# Patient Record
Sex: Female | Born: 1965 | Race: White | Hispanic: No | Marital: Married | Smoking: Never smoker
Health system: Southern US, Community
[De-identification: ages and names within clinical notes are randomized; demographics above are authoritative.]

## PROBLEM LIST (undated history)

## (undated) DIAGNOSIS — N289 Disorder of kidney and ureter, unspecified: Secondary | ICD-10-CM

## (undated) DIAGNOSIS — T7840XA Allergy, unspecified, initial encounter: Secondary | ICD-10-CM

## (undated) DIAGNOSIS — N200 Calculus of kidney: Secondary | ICD-10-CM

## (undated) HISTORY — PX: TUBAL LIGATION: SHX77

## (undated) HISTORY — DX: Allergy, unspecified, initial encounter: T78.40XA

## (undated) HISTORY — DX: Calculus of kidney: N20.0

---

## 2005-12-03 ENCOUNTER — Emergency Department: Payer: Self-pay | Admitting: Emergency Medicine

## 2010-06-29 ENCOUNTER — Emergency Department: Payer: Self-pay | Admitting: Emergency Medicine

## 2011-03-22 ENCOUNTER — Ambulatory Visit: Payer: Self-pay | Admitting: Podiatry

## 2011-04-04 ENCOUNTER — Ambulatory Visit: Payer: Self-pay | Admitting: Internal Medicine

## 2012-08-14 ENCOUNTER — Emergency Department: Payer: Self-pay | Admitting: Internal Medicine

## 2012-08-14 LAB — CBC
HCT: 37.6 % (ref 35.0–47.0)
MCH: 28.4 pg (ref 26.0–34.0)
MCHC: 32 g/dL (ref 32.0–36.0)
MCV: 89 fL (ref 80–100)
Platelet: 208 10*3/uL (ref 150–440)
RBC: 4.24 10*6/uL (ref 3.80–5.20)
WBC: 5.5 10*3/uL (ref 3.6–11.0)

## 2012-08-14 LAB — COMPREHENSIVE METABOLIC PANEL
Anion Gap: 6 — ABNORMAL LOW (ref 7–16)
BUN: 21 mg/dL — ABNORMAL HIGH (ref 7–18)
Chloride: 107 mmol/L (ref 98–107)
Co2: 27 mmol/L (ref 21–32)
Creatinine: 0.75 mg/dL (ref 0.60–1.30)
Osmolality: 282 (ref 275–301)
Potassium: 3.9 mmol/L (ref 3.5–5.1)

## 2012-08-14 LAB — URINALYSIS, COMPLETE
Bilirubin,UR: NEGATIVE
Ketone: NEGATIVE
Leukocyte Esterase: NEGATIVE
Nitrite: NEGATIVE
Ph: 6 (ref 4.5–8.0)
Protein: NEGATIVE
Specific Gravity: 1.018 (ref 1.003–1.030)
Squamous Epithelial: 1

## 2012-08-15 ENCOUNTER — Emergency Department: Payer: Self-pay | Admitting: Emergency Medicine

## 2012-08-15 LAB — CBC
HCT: 38.7 % (ref 35.0–47.0)
MCHC: 33.4 g/dL (ref 32.0–36.0)
MCV: 88 fL (ref 80–100)
Platelet: 206 10*3/uL (ref 150–440)
RBC: 4.42 10*6/uL (ref 3.80–5.20)
RDW: 13.8 % (ref 11.5–14.5)

## 2012-08-15 LAB — BASIC METABOLIC PANEL
BUN: 18 mg/dL (ref 7–18)
Chloride: 109 mmol/L — ABNORMAL HIGH (ref 98–107)
Potassium: 3.7 mmol/L (ref 3.5–5.1)
Sodium: 139 mmol/L (ref 136–145)

## 2012-08-15 LAB — URINALYSIS, COMPLETE
Bacteria: NONE SEEN
Glucose,UR: NEGATIVE mg/dL (ref 0–75)
Leukocyte Esterase: NEGATIVE
Ph: 5 (ref 4.5–8.0)
RBC,UR: 8 /HPF (ref 0–5)
Specific Gravity: 1.016 (ref 1.003–1.030)
Squamous Epithelial: 2
WBC UR: 4 /HPF (ref 0–5)

## 2013-08-28 LAB — COMPREHENSIVE METABOLIC PANEL
ALT: 27 U/L (ref 12–78)
Albumin: 3.9 g/dL (ref 3.4–5.0)
Alkaline Phosphatase: 45 U/L
Anion Gap: 5 — ABNORMAL LOW (ref 7–16)
BILIRUBIN TOTAL: 0.4 mg/dL (ref 0.2–1.0)
BUN: 18 mg/dL (ref 7–18)
CHLORIDE: 107 mmol/L (ref 98–107)
Calcium, Total: 8.8 mg/dL (ref 8.5–10.1)
Co2: 27 mmol/L (ref 21–32)
Creatinine: 1 mg/dL (ref 0.60–1.30)
Glucose: 119 mg/dL — ABNORMAL HIGH (ref 65–99)
Osmolality: 281 (ref 275–301)
POTASSIUM: 3.8 mmol/L (ref 3.5–5.1)
SGOT(AST): 38 U/L — ABNORMAL HIGH (ref 15–37)
Sodium: 139 mmol/L (ref 136–145)
Total Protein: 7.3 g/dL (ref 6.4–8.2)

## 2013-08-28 LAB — TROPONIN I
Troponin-I: <0.02 ng/mL
Troponin-I: <0.02 ng/mL

## 2013-08-28 LAB — CBC
HCT: 39.4 % (ref 35.0–47.0)
HGB: 13 g/dL (ref 12.0–16.0)
MCH: 29.9 pg (ref 26.0–34.0)
MCHC: 33 g/dL (ref 32.0–36.0)
MCV: 90 fL (ref 80–100)
Platelet: 254 10*3/uL (ref 150–440)
RBC: 4.36 10*6/uL (ref 3.80–5.20)
RDW: 13.5 % (ref 11.5–14.5)
WBC: 7.9 10*3/uL (ref 3.6–11.0)

## 2013-08-28 LAB — LIPASE, BLOOD: Lipase: 174 U/L (ref 73–393)

## 2013-08-29 ENCOUNTER — Observation Stay: Payer: Self-pay | Admitting: Internal Medicine

## 2013-08-29 LAB — CBC WITH DIFFERENTIAL/PLATELET
BASOS ABS: 0.1 10*3/uL (ref 0.0–0.1)
Basophil %: 1.1 %
EOS PCT: 1.8 %
Eosinophil #: 0.1 10*3/uL (ref 0.0–0.7)
HCT: 35.3 % (ref 35.0–47.0)
HGB: 11.8 g/dL — ABNORMAL LOW (ref 12.0–16.0)
Lymphocyte #: 2.1 10*3/uL (ref 1.0–3.6)
Lymphocyte %: 35.8 %
MCH: 30.1 pg (ref 26.0–34.0)
MCHC: 33.4 g/dL (ref 32.0–36.0)
MCV: 90 fL (ref 80–100)
Monocyte #: 0.5 x10 3/mm (ref 0.2–0.9)
Monocyte %: 8.2 %
Neutrophil #: 3.2 10*3/uL (ref 1.4–6.5)
Neutrophil %: 53.1 %
Platelet: 202 10*3/uL (ref 150–440)
RBC: 3.91 10*6/uL (ref 3.80–5.20)
RDW: 13.6 % (ref 11.5–14.5)
WBC: 5.9 10*3/uL (ref 3.6–11.0)

## 2013-08-29 LAB — CK TOTAL AND CKMB (NOT AT ARMC)
CK, TOTAL: 33 U/L (ref 21–215)
CK, Total: 36 U/L (ref 21–215)
CK-MB: 0.7 ng/mL (ref 0.5–3.6)
CK-MB: 0.8 ng/mL (ref 0.5–3.6)

## 2013-08-29 LAB — BASIC METABOLIC PANEL
Anion Gap: 5 — ABNORMAL LOW (ref 7–16)
BUN: 18 mg/dL (ref 7–18)
CHLORIDE: 110 mmol/L — AB (ref 98–107)
Calcium, Total: 8.5 mg/dL (ref 8.5–10.1)
Co2: 24 mmol/L (ref 21–32)
Creatinine: 0.75 mg/dL (ref 0.60–1.30)
EGFR (Non-African Amer.): >60
Glucose: 86 mg/dL (ref 65–99)
Osmolality: 279 (ref 275–301)
Potassium: 3.9 mmol/L (ref 3.5–5.1)
SODIUM: 139 mmol/L (ref 136–145)

## 2013-08-29 LAB — URINALYSIS, COMPLETE
Bilirubin,UR: NEGATIVE
Glucose,UR: NEGATIVE mg/dL (ref 0–75)
Hyaline Cast: 1
Ketone: NEGATIVE
LEUKOCYTE ESTERASE: NEGATIVE
Nitrite: NEGATIVE
Ph: 7 (ref 4.5–8.0)
Protein: NEGATIVE
RBC,UR: 20 /HPF (ref 0–5)
Specific Gravity: 1.021 (ref 1.003–1.030)

## 2013-08-29 LAB — MAGNESIUM: Magnesium: 1.9 mg/dL

## 2013-08-29 LAB — TROPONIN I: Troponin-I: <0.02 ng/mL

## 2014-11-26 NOTE — Consult Note (Signed)
PATIENT NAME:  Becky Hatfield, Becky Hatfield MR#:  [id number] DATE OF BIRTH:  1965-08-07  DATE OF CONSULTATION:  08/29/2013  REFERRING PHYSICIAN:  Dr. Margaretmary Eddy CONSULTING PHYSICIAN:  Corey Skains, MD  REASON FOR CONSULTATION: Chest pain consistent with unstable angina.   CHIEF COMPLAINT: "I have chest pain."   HISTORY OF PRESENT ILLNESS: This is a 49 year old female with no evidence of significant cardiovascular risk factors and no hypertension, hyperlipidemia or previous cardiovascular disease that has had new onset of substernal chest discomfort, pressure in nature occurring at rest consistent with [place] class IV angina radiating into her right side associated with shortness of breath and weakness. This lasted off and on and was not necessarily relieved by any intervention including nonsteroidals and/or nitroglycerin. She does feel somewhat improved at this time with an EKG which was normal and troponins which are normal. There are no other cardiovascular history or significant symptoms.  REVIEW OF SYSTEMS:  Negative for vision change, ringing in the ears, hearing loss, cough, congestion, heartburn, nausea, vomiting, diarrhea, bloody stools, stomach pain, extremity pain, leg weakness, cramping of the buttocks, known blood clots, headaches, blackouts, dizzy spells, nosebleeds, congestion, trouble swallowing, frequent urination, urination at night, muscle weakness, numbness, anxiety, skin rashes.   PAST MEDICAL HISTORY: None.   FAMILY HISTORY: No family members with early onset of cardiovascular disease or hypertension.   SOCIAL HISTORY: Currently denies alcohol or tobacco use.   ALLERGIES: No known drug allergies.   CURRENT MEDICATIONS: As listed.   PHYSICAL EXAMINATION: VITAL SIGNS: Blood pressure is 110/68 bilaterally, heart rate 72 upright, reclining, and regular.    GENERAL: She is a well appearing female in no acute distress.  HEENT: No icterus, thyromegaly, ulcers, hemorrhage or xanthelasma.   CARDIOVASCULAR: Regular rate and rhythm. Normal S1 and S2 without murmur, gallop, or rub. PMI is normal size and placement. Carotid upstroke normal without bruit. Jugular venous pressure is normal.  LUNGS: Clear to auscultation with normal respirations.  ABDOMEN: Soft, nontender without hepatosplenomegaly or masses. Abdominal aorta is normal size without bruit.  EXTREMITIES: Showing 2+ bilateral pulses in dorsal, pedal, radial and femoral arteries without lower extremity edema, cyanosis, clubbing or ulcers.  NEUROLOGIC: She is oriented to time, place, and person, with normal mood and affect.   ASSESSMENT: A 49 year old female with no cardiovascular history having chest discomfort, atypical in nature, with a normal troponin, normal EKG, and no evidence of myocardial infarction.   RECOMMENDATIONS: 1. Continue serial ECG and enzymes to assess for myocardial infarction.  2. Echocardiogram for LV systolic dysfunction, valvular heart disease causing above.  3. Ambulation and follow for any further significant symptoms. 4. Stress test versus discharge to home with outpatient work-up thereafter.  5. Further treatment and options for possible viral chest discomfort.  ____________________________ Corey Skains, MD bjk:sg D: 08/29/2013 07:14:00 ET T: 08/29/2013 10:55:15 ET JOB#: [id number]  cc: Corey Skains, MD, <Dictator> Corey Skains MD ELECTRONICALLY SIGNED 09/10/2013 9:49

## 2014-11-26 NOTE — H&P (Signed)
PATIENT NAME:  Becky Hatfield, Becky Hatfield MR#:  [id number] DATE OF BIRTH:  06-03-1966  DATE OF ADMISSION:  08/29/2013  PRIMARY CARE PHYSICIAN:  Dr. Jarome Lamas.  REFERRING PHYSICIAN:  Dr. Joni Fears.   CHIEF COMPLAINT:  Chest pain.   HISTORY OF PRESENT ILLNESS:  The patient is a 49 year old Caucasian female with a past medical history of nephrolithiasis and Tietze syndrome is presenting to the ER with a chief complaint of midsternal reproducible chest pain, started at 7:00 p.m. last night.  Initially it was associated with a little bit of shortness of breath and nausea, but denies any vomiting.  Denies any diaphoresis.  The pain is aching and squeezing in nature.  It is with no radiation and intermittent.  The patient has chronic history of Tietze syndrome and takes ibuprofen as needed basis.  Her physician has told her to take ibuprofen as needed basis.  The patient is reporting that she is stressed out and she thinks probably her Tietze syndrome is acting up.  The patient's chest pain is not completely resolved which made her come to the ER today.  The patient was given sublingual nitroglycerin, following that her chest pain is resolved.  Subsequently nitro paste is attached to the anterior chest wall.  The pain was resolved with nitro paste, but during my examination, she is still complaining that the pain is coming back.  She was not seen by any cardiologist in the past.  Never had any stress test done.  Denies any abdominal pain.  No other complaints.   PAST MEDICAL HISTORY:  Nephrolithiasis, Tietze syndrome.   PAST SURGICAL HISTORY:  Tubal ligation.   ALLERGIES:  No known drug allergies.   PSYCHOSOCIAL HISTORY:  Lives at home, lives with husband.  No smoking, alcohol or illicit drug usage.   MEDICATIONS:  Not on any home medications.   FAMILY HISTORY:  Mother and father has hypertension and father deceased with cerebral aneurysm.   REVIEW OF SYSTEMS:  CONSTITUTIONAL:  Denies fever, fatigue. EYES:   Denies blurry vision, double vision.  EARS, NOSE, THROAT:  Denies epistaxis or discharge.  RESPIRATION:  Denies cough, COPD.  CARDIOVASCULAR:  Complaining of reproducible chest pain in the midsternal area with no radiation.  Denies palpitations.  GASTROINTESTINAL:  Nauseous, but no vomiting, diarrhea.  GENITOURINARY:  No dysuria or hematuria.  GYNECOLOGIC AND BREAST:  Denies breast mass or vaginal discharge.  ENDOCRINE:  Denies polyuria, nocturia.  HEMATOLOGIC AND LYMPHATIC:  No anemia, easy bruising, bleeding.  INTEGUMENTARY:  No acne, rash, lesions.  MUSCULOSKELETAL:  No joint pain in the neck and back.  Denies gout.   NEUROLOGIC:  Denies any vertigo or ataxia.  PSYCHIATRIC:  No ADD, OCD.   PHYSICAL EXAMINATION:   VITAL SIGNS:  Temperature is not recorded, pulse 88, respirations 16 to 18, blood pressure 112/67, sating 100% on room air.  GENERAL APPEARANCE:  Not under acute distress.  Moderately built and thin-looking emaciated lady.  HEENT:  Normocephalic, atraumatic.  Pupils are equal, reacting to light and accommodation.  No scleral icterus.  No conjunctival injection.  No sinus tenderness.  No postnasal drip.  Moist mucous membranes.  NECK:  Supple.  No JVD.  No thyromegaly.  LUNGS:  Clear to auscultation bilaterally.  No accessory muscle usage.  Positive anterior chest wall tenderness in the midsternal area on palpation.  CARDIAC:  S1, S2 normal.  Regular rate and rhythm.  No murmurs.  GASTROINTESTINAL:  Soft.  Bowel sounds are positive in all four quadrants.  Nontender, nondistended.  No masses felt.  No hepatosplenomegaly.  NEUROLOGIC:  Awake, alert, oriented x 3.  Motor and sensory grossly intact.  Reflexes are 2+.  EXTREMITIES:  No edema.  No cyanosis.  No clubbing.  SKIN:  Warm to touch.  Normal turgor.  No rashes.  No lesions.  MUSCULOSKELETAL:  No joint effusion, tenderness, erythema.  PSYCHIATRIC:  Normal mood and affect.   LABORATORY AND IMAGING STUDIES:  LFTs normal except  AST which is slightly elevated at 38.  Troponin less than 0.02 x 2.  CBC normal.  D-dimer 0.26.  Chem-8 is normal, except anion gap which is at 5 and glucose is at 119.  Ultrasound of the abdomen, no acute findings.  Liver granuloma, right renal calculus.  Chest x-ray, portable:  No active disease.  A 12-lead EKG, normal sinus rhythm, normal PR and QRS interval.  No acute ST-T wave changes.   ASSESSMENT AND PLAN:  A 49 year old pleasant Caucasian female with chronic history of Tietze syndrome is presenting to the ER with a chief complaint of midsternal chest pain which is intermittent in nature.  It started at 7:00 p.m. yesterday, will be admitted with the following assessment and plan.  1.  Chest pain, rule out acute myocardial infarction, probably from Tietze syndrome.  Admit her to telemetry, acute coronary syndrome protocol with nitroglycerin, aspirin, low-dose beta blocker and statin.  Cycle cardiac biomarkers.  Cardiology consult is placed.  2.  History of Tietze syndrome.  We will provide her ibuprofen as needed basis.  3.  We will provide gastrointestinal and deep vein thrombosis prophylaxis.   Diagnosis and plan of care was discussed in detail with the patient and her husband at bedside.  They both verbalized understanding of the plan.    Total time spent on the admission is 45 minutes.     ____________________________ Nicholes Mango, MD ag:ea D: 08/29/2013 00:46:42 ET T: 08/29/2013 01:58:01 ET JOB#: [id number]  cc: Nicholes Mango, MD, <Dictator> Irven Easterly. Kary Kos, MD Nicholes Mango MD ELECTRONICALLY SIGNED 09/10/2013 7:36

## 2014-11-26 NOTE — Discharge Summary (Signed)
PATIENT NAME:  Becky Hatfield, Becky Hatfield MR#:  [id number] DATE OF BIRTH:  1966-02-20  DATE OF ADMISSION:  08/29/2013  DATE OF DISCHARGE:  08/29/2013  ADMISSION DIAGNOSIS: Chest pain.   DISCHARGE DIAGNOSIS: Chest pain.   PERTINENT LABORATORIES: Troponins x 3 were negative. White blood cells 5.9, hemoglobin 12, hematocrit 35.3, platelets are 282. Sodium 139, potassium 3.9, chloride 110, bicarb 24, BUN 18, creatinine 0.75, glucose 86, magnesium 1.9.   CONSULTATIONS: Cardiology.   HOSPITAL COURSE:  A 49 year old female who presented with chest pain. For further details, please refer to the H and P.   1. Chest pain. The patient was admitted to telemetry. Her cardiac enzymes were normal. Cardiology was consulted. I spoke with Dr. Nehemiah Massed, who felt that the patient was low risk for cardiac disease. I did recommend an outpatient stress test. The patient will follow up with Dr. Nehemiah Massed early next week for an outpatient stress test. Plan of care was discussed with the patient.  2.  Tietze syndrome, which is inflammation of the sternum. She uses NSAIDs p.r.n.   3.  Kidney stone. Asymptomatic at this time.   DISCHARGE MEDICATIONS: Aspirin 81 mg daily.   DISCHARGE DIET:  Regular diet.   DISCHARGE ACTIVITY: As tolerated.   DISCHARGE FOLLOW UP:  The patient will follow up with Dr. Nehemiah Massed early next week.   TIME SPENT: Approximately 35 minutes. The patient is medically stable for discharge.    ____________________________ Duan Scharnhorst P. Benjie Karvonen, MD spm:mr D: 08/29/2013 12:31:21 ET T: 08/29/2013 19:52:52 ET JOB#: [id number]  cc: Yohannes Waibel P. Benjie Karvonen, MD, <Dictator> Corey Skains, MD Irven Easterly. Kary Kos, MD   Donell Beers Jazmynn Pho MD ELECTRONICALLY SIGNED 08/30/2013 14:06

## 2015-07-03 ENCOUNTER — Telehealth: Payer: Self-pay | Admitting: *Deleted

## 2015-07-03 NOTE — Telephone Encounter (Signed)
-----   Message from Francia Greaves sent at 07/03/2015  2:57 PM EST ----- Regarding: Returning Phone Call Contact: [phone] Called returning a phone call, birth control related maybe?

## 2015-07-27 ENCOUNTER — Encounter: Payer: Self-pay | Admitting: Obstetrics and Gynecology

## 2015-07-27 ENCOUNTER — Ambulatory Visit (INDEPENDENT_AMBULATORY_CARE_PROVIDER_SITE_OTHER): Payer: BC Managed Care – PPO | Admitting: Obstetrics and Gynecology

## 2015-07-27 VITALS — BP 118/77 | HR 79 | Wt 149.0 lb

## 2015-07-27 DIAGNOSIS — Z124 Encounter for screening for malignant neoplasm of cervix: Secondary | ICD-10-CM | POA: Diagnosis not present

## 2015-07-27 DIAGNOSIS — Z01419 Encounter for gynecological examination (general) (routine) without abnormal findings: Secondary | ICD-10-CM | POA: Diagnosis not present

## 2015-07-27 DIAGNOSIS — Z1151 Encounter for screening for human papillomavirus (HPV): Secondary | ICD-10-CM

## 2015-07-27 NOTE — Progress Notes (Signed)
  Subjective:     Becky Hatfield is a 49 y.o. female G89P2002 perimenopausal who is here for a comprehensive physical exam. The patient reports no problems. She is transferring her care from [facility] secondary to proximity to her home. She was last seen there 3 years ago. She has not had a mammogram in the last 3 years. She is sexually active using BTL for contraception. She reports irregular menses lasting 4-5 days. She often skips months. She reports some hot flushes and night sweats which are manageable. She denies urinary incontinence.  History reviewed. No pertinent past medical history. Past Surgical History  Procedure Laterality Date  . Tubal ligation     Family History  Problem Relation Age of Onset  . Hypertension Mother   . Hypertension Father     Social History   Social History  . Marital Status: Married    Spouse Name: N/A  . Number of Children: N/A  . Years of Education: N/A   Occupational History  . Not on file.   Social History Main Topics  . Smoking status: Never Smoker   . Smokeless tobacco: Never Used  . Alcohol Use: No  . Drug Use: No  . Sexual Activity:    Partners: Male    Birth Control/ Protection: Surgical   Other Topics Concern  . Not on file   Social History Narrative  . No narrative on file   Health Maintenance  Topic Date Due  . HIV Screening  03/16/1980  . TETANUS/TDAP  03/16/1984  . PAP SMEAR  03/16/1986  . INFLUENZA VACCINE  03/06/2015  . MAMMOGRAM  03/17/2015  . COLONOSCOPY  03/17/2015       Review of Systems Pertinent items are noted in HPI.   Objective:      GENERAL: Well-developed, well-nourished female in no acute distress.  HEENT: Normocephalic, atraumatic. Sclerae anicteric.  NECK: Supple. Normal thyroid.  LUNGS: Clear to auscultation bilaterally.  HEART: Regular rate and rhythm. BREASTS: Symmetric in size. No palpable masses or lymphadenopathy, skin changes, or nipple drainage. Patient has  extra mammary nipples  bilaterally ABDOMEN: Soft, nontender, nondistended. No organomegaly. PELVIC: Normal external female genitalia. Vagina is pink and rugated.  Normal discharge. Normal appearing cervix. Uterus is normal in size. No adnexal mass or tenderness. EXTREMITIES: No cyanosis, clubbing, or edema, 2+ distal pulses.    Assessment:    Healthy female exam.      Plan:    Pap smear performed Screening mammogram ordered Patient advised to perform self breast and vulva exams monthly Patient will be contacted with any abnormal results RTC in 1 year or prn See After Visit Summary for Counseling Recommendations

## 2015-08-01 LAB — CYTOLOGY - PAP

## 2015-08-02 ENCOUNTER — Encounter: Payer: Self-pay | Admitting: *Deleted

## 2015-08-24 ENCOUNTER — Ambulatory Visit
Admission: RE | Admit: 2015-08-24 | Discharge: 2015-08-24 | Disposition: A | Discharge status: Home / Self Care | Payer: BC Managed Care – PPO | Source: Ambulatory Visit | Attending: Obstetrics and Gynecology | Admitting: Obstetrics and Gynecology

## 2015-08-24 DIAGNOSIS — Z1231 Encounter for screening mammogram for malignant neoplasm of breast: Secondary | ICD-10-CM | POA: Diagnosis present

## 2015-08-24 DIAGNOSIS — Z01419 Encounter for gynecological examination (general) (routine) without abnormal findings: Secondary | ICD-10-CM

## 2015-09-05 ENCOUNTER — Emergency Department
Admission: EM | Admit: 2015-09-05 | Discharge: 2015-09-05 | Disposition: A | Discharge status: Home / Self Care | Payer: BC Managed Care – PPO | Attending: Emergency Medicine | Admitting: Emergency Medicine

## 2015-09-05 ENCOUNTER — Encounter: Payer: Self-pay | Admitting: Emergency Medicine

## 2015-09-05 ENCOUNTER — Emergency Department: Payer: BC Managed Care – PPO

## 2015-09-05 DIAGNOSIS — Z3202 Encounter for pregnancy test, result negative: Secondary | ICD-10-CM | POA: Insufficient documentation

## 2015-09-05 DIAGNOSIS — N200 Calculus of kidney: Secondary | ICD-10-CM | POA: Diagnosis not present

## 2015-09-05 DIAGNOSIS — R109 Unspecified abdominal pain: Secondary | ICD-10-CM | POA: Diagnosis present

## 2015-09-05 HISTORY — DX: Disorder of kidney and ureter, unspecified: N28.9

## 2015-09-05 LAB — BASIC METABOLIC PANEL
ANION GAP: 7 (ref 5–15)
BUN: 14 mg/dL (ref 6–20)
CALCIUM: 9.4 mg/dL (ref 8.9–10.3)
CO2: 26 mmol/L (ref 22–32)
CREATININE: 0.68 mg/dL (ref 0.44–1.00)
Chloride: 106 mmol/L (ref 101–111)
GFR calc non Af Amer: >60 mL/min (ref >60)
GLUCOSE: 100 mg/dL — AB (ref 65–99)
Potassium: 3.7 mmol/L (ref 3.5–5.1)
Sodium: 139 mmol/L (ref 135–145)

## 2015-09-05 LAB — URINALYSIS COMPLETE WITH MICROSCOPIC (ARMC ONLY)
Bilirubin Urine: NEGATIVE
Glucose, UA: NEGATIVE mg/dL
Ketones, ur: NEGATIVE mg/dL
LEUKOCYTES UA: NEGATIVE
Nitrite: NEGATIVE
PROTEIN: NEGATIVE mg/dL
SPECIFIC GRAVITY, URINE: 1.009 (ref 1.005–1.030)
pH: 6 (ref 5.0–8.0)

## 2015-09-05 LAB — CBC
HCT: 41.9 % (ref 35.0–47.0)
Hemoglobin: 14 g/dL (ref 12.0–16.0)
MCH: 29.7 pg (ref 26.0–34.0)
MCHC: 33.5 g/dL (ref 32.0–36.0)
MCV: 88.4 fL (ref 80.0–100.0)
PLATELETS: 247 10*3/uL (ref 150–440)
RBC: 4.73 MIL/uL (ref 3.80–5.20)
RDW: 13.5 % (ref 11.5–14.5)
WBC: 6.2 10*3/uL (ref 3.6–11.0)

## 2015-09-05 LAB — POCT PREGNANCY, URINE: Preg Test, Ur: NEGATIVE

## 2015-09-05 MED ORDER — TAMSULOSIN HCL 0.4 MG PO CAPS: 0.4000 mg | ORAL_CAPSULE | Freq: Every day | ORAL | Status: DC

## 2015-09-05 MED ORDER — OXYCODONE-ACETAMINOPHEN 5-325 MG PO TABS
ORAL_TABLET | ORAL | Status: AC
  Administered 2015-09-05: 2 via ORAL
  Filled 2015-09-05: qty 2

## 2015-09-05 MED ORDER — OXYCODONE-ACETAMINOPHEN 5-325 MG PO TABS
2.0000 | ORAL_TABLET | Freq: Once | ORAL | Status: AC
  Administered 2015-09-05: 2 via ORAL

## 2015-09-05 MED ORDER — ONDANSETRON HCL 4 MG PO TABS: 4.0000 mg | ORAL_TABLET | Freq: Three times a day (TID) | ORAL | Status: DC | PRN

## 2015-09-05 MED ORDER — OXYCODONE-ACETAMINOPHEN 5-325 MG PO TABS: 1.0000 | ORAL_TABLET | ORAL | Status: DC | PRN

## 2015-09-05 NOTE — ED Provider Notes (Signed)
[facility] Emergency Department Provider Note   ____________________________________________  Time seen: ~1500  I have reviewed the triage vital signs and the nursing notes.   HISTORY  Chief Complaint Flank Pain   History limited by: Not Limited   HPI Becky Hatfield is a 50 y.o. female with history of kidney stones who presents to the emergency department todaybecause of right flank pain. She states that the pain has been somewhat on and off for the past week. She states today however the pain became worse. She describes it as sharp. It is located in the right flank and radiates towards the groin. It has been intermittent. It was severe. She states it does remind her of her previous kidney stones. She has not noticed any fevers. She did have some nausea earlier today.     Past Medical History  Diagnosis Date  . Renal disorder     There are no active problems to display for this patient.   Past Surgical History  Procedure Laterality Date  . Tubal ligation      No current outpatient prescriptions on file.  Allergies Review of patient's allergies indicates no known allergies.  Family History  Problem Relation Age of Onset  . Hypertension Mother   . Hypertension Father   . Breast cancer Neg Hx     Social History Social History  Substance Use Topics  . Smoking status: Never Smoker   . Smokeless tobacco: Never Used  . Alcohol Use: No    Review of Systems  Constitutional: Negative for fever. Cardiovascular: Negative for chest pain. Respiratory: Negative for shortness of breath. Gastrointestinal: Positive for right flank pain Neurological: Negative for headaches, focal weakness or numbness.   10-point ROS otherwise negative.  ____________________________________________   PHYSICAL EXAM:  VITAL SIGNS: ED Triage Vitals  Enc Vitals Group     BP 09/05/15 1225 160/96 mmHg     Pulse Rate 09/05/15 1225 92     Resp 09/05/15 1225  18     Temp 09/05/15 1225 98.1 F (36.7 C)     Temp Source 09/05/15 1225 Oral     SpO2 09/05/15 1225 99 %     Weight 09/05/15 1225 149 lb (67.586 kg)     Height 09/05/15 1225 5\' 4"  (1.626 m)     Head Cir --      Peak Flow --      Pain Score 09/05/15 1222 9   Constitutional: Alert and oriented. Well appearing and in no distress. Eyes: Conjunctivae are normal. PERRL. Normal extraocular movements. ENT   Head: Normocephalic and atraumatic.   Nose: No congestion/rhinnorhea.   Mouth/Throat: Mucous membranes are moist.   Neck: No stridor. Hematological/Lymphatic/Immunilogical: No cervical lymphadenopathy. Cardiovascular: Normal rate, regular rhythm.  No murmurs, rubs, or gallops. Respiratory: Normal respiratory effort without tachypnea nor retractions. Breath sounds are clear and equal bilaterally. No wheezes/rales/rhonchi. Gastrointestinal: Soft and minimally tender to palpation in the right lower abdomen. No rebound. No guarding. There is no CVA tenderness. Genitourinary: Deferred Musculoskeletal: Normal range of motion in all extremities. No joint effusions.  No lower extremity tenderness nor edema. Neurologic:  Normal speech and language. No gross focal neurologic deficits are appreciated.  Skin:  Skin is warm, dry and intact. No rash noted. Psychiatric: Mood and affect are normal. Speech and behavior are normal. Patient exhibits appropriate insight and judgment.  ____________________________________________    LABS (pertinent positives/negatives)  Labs Reviewed  URINALYSIS COMPLETEWITH MICROSCOPIC ([place]) - Abnormal; Notable for the following:  Color, Urine YELLOW (*)    APPearance CLEAR (*)    Hgb urine dipstick 3+ (*)    Bacteria, UA RARE (*)    Squamous Epithelial / LPF 0-5 (*)    All other components within normal limits  BASIC METABOLIC PANEL - Abnormal; Notable for the following:    Glucose, Bld 100 (*)    All other components within normal limits   CBC  POCT PREGNANCY, URINE  POC URINE PREG, ED     ____________________________________________   EKG  None  ____________________________________________    RADIOLOGY  US renal IMPRESSION: Moderate right-sided hydronephrosis possibly due to a partially obstructing right ureteral calculus.  [place] right renal calculus measuring 6 mm.  Bilateral jets noted.   ____________________________________________   PROCEDURES  Procedure(s) performed: None  Critical Care performed: No  ____________________________________________   INITIAL IMPRESSION / ASSESSMENT AND PLAN / ED COURSE  Pertinent labs & imaging results that were available during my care of the patient were reviewed by me and considered in my medical decision making (see chart for details).  Patient presented to the emergency department today because of concerns for right flank pain. Patient does have a history of kidney stones. There was blood in the urine so an ultrasound was performed which showed mild to moderate hydronephrosis however bilateral jets were present. At this point patient's pain is well-controlled. Will plan on discharging home with Flomax, pain medication and antiemetics. Discussed with patient finding of ultrasound and that she should follow-up with urology clinic. No signs of any infection.  ____________________________________________   FINAL CLINICAL IMPRESSION(S) / ED DIAGNOSES  Final diagnoses:  Kidney stone     Nance Pear, MD 09/05/15 1726

## 2015-09-05 NOTE — Discharge Instructions (Signed)
Please seek medical attention for any high fevers, chest pain, shortness of breath, change in behavior, persistent vomiting, bloody stool or any other new or concerning symptoms. ° ° °Kidney Stones °Kidney stones (urolithiasis) are deposits that form inside your kidneys. The intense pain is caused by the stone moving through the urinary tract. When the stone moves, the ureter goes into spasm around the stone. The stone is usually passed in the urine.  °CAUSES  °· A disorder that makes certain neck glands produce too much parathyroid hormone (primary hyperparathyroidism). °· A buildup of uric acid crystals, similar to gout in your joints. °· Narrowing (stricture) of the ureter. °· A kidney obstruction present at birth (congenital obstruction). °· Previous surgery on the kidney or ureters. °· Numerous kidney infections. °SYMPTOMS  °· Feeling sick to your stomach (nauseous). °· Throwing up (vomiting). °· Blood in the urine (hematuria). °· Pain that usually spreads (radiates) to the groin. °· Frequency or urgency of urination. °DIAGNOSIS  °· Taking a history and physical exam. °· Blood or urine tests. °· CT scan. °· Occasionally, an examination of the inside of the urinary bladder (cystoscopy) is performed. °TREATMENT  °· Observation. °· Increasing your fluid intake. °· Extracorporeal shock wave lithotripsy--This is a noninvasive procedure that uses shock waves to break up kidney stones. °· Surgery may be needed if you have severe pain or persistent obstruction. There are various surgical procedures. Most of the procedures are performed with the use of small instruments. Only small incisions are needed to accommodate these instruments, so recovery time is minimized. °The size, location, and chemical composition are all important variables that will determine the proper choice of action for you. Talk to your health care provider to better understand your situation so that you will minimize the risk of injury to yourself  and your kidney.  °HOME CARE INSTRUCTIONS  °· Drink enough water and fluids to keep your urine clear or pale yellow. This will help you to pass the stone or stone fragments. °· Strain all urine through the provided strainer. Keep all particulate matter and stones for your health care provider to see. The stone causing the pain may be as small as a grain of salt. It is very important to use the strainer each and every time you pass your urine. The collection of your stone will allow your health care provider to analyze it and verify that a stone has actually passed. The stone analysis will often identify what you can do to reduce the incidence of recurrences. °· Only take over-the-counter or prescription medicines for pain, discomfort, or fever as directed by your health care provider. °· Keep all follow-up visits as told by your health care provider. This is important. °· Get follow-up X-rays if required. The absence of pain does not always mean that the stone has passed. It may have only stopped moving. If the urine remains completely obstructed, it can cause loss of kidney function or even complete destruction of the kidney. It is your responsibility to make sure X-rays and follow-ups are completed. Ultrasounds of the kidney can show blockages and the status of the kidney. Ultrasounds are not associated with any radiation and can be performed easily in a matter of minutes. °· Make changes to your daily diet as told by your health care provider. You may be told to: °¨ Limit the amount of salt that you eat. °¨ Eat 5 or more servings of fruits and vegetables each day. °¨ Limit the amount of meat,   poultry, fish, and eggs that you eat. °· Collect a 24-hour urine sample as told by your health care provider. You may need to collect another urine sample every 6-12 months. °SEEK MEDICAL CARE IF: °· You experience pain that is progressive and unresponsive to any pain medicine you have been prescribed. °SEEK IMMEDIATE  MEDICAL CARE IF:  °· Pain cannot be controlled with the prescribed medicine. °· You have a fever or shaking chills. °· The severity or intensity of pain increases over 18 hours and is not relieved by pain medicine. °· You develop a new onset of abdominal pain. °· You feel faint or pass out. °· You are unable to urinate. °  °This information is not intended to replace advice given to you by your health care provider. Make sure you discuss any questions you have with your health care provider. °  °Document Released: 07/22/2005 Document Revised: 04/12/2015 Document Reviewed: 12/23/2012 °Elsevier Interactive Patient Education ©2016 [employer] ° °

## 2015-09-05 NOTE — ED Notes (Signed)
Pt to ed with c/o right flank pain, hx of kidney stones,  Pt denies difficulty with urination but reports dark colored urine.  Pt alert and oriented,  Appears in mild distress.

## 2015-09-05 NOTE — ED Notes (Signed)
Pt informed to return if any life threatening symptoms occur.  

## 2016-07-31 ENCOUNTER — Ambulatory Visit (INDEPENDENT_AMBULATORY_CARE_PROVIDER_SITE_OTHER): Payer: BC Managed Care – PPO | Admitting: Obstetrics and Gynecology

## 2016-07-31 ENCOUNTER — Encounter: Payer: Self-pay | Admitting: Obstetrics and Gynecology

## 2016-07-31 VITALS — BP 132/83 | HR 94 | Ht 64.0 in | Wt 148.0 lb

## 2016-07-31 DIAGNOSIS — Z124 Encounter for screening for malignant neoplasm of cervix: Secondary | ICD-10-CM

## 2016-07-31 DIAGNOSIS — Z1151 Encounter for screening for human papillomavirus (HPV): Secondary | ICD-10-CM

## 2016-07-31 DIAGNOSIS — Z01419 Encounter for gynecological examination (general) (routine) without abnormal findings: Secondary | ICD-10-CM

## 2016-07-31 LAB — LIPID PANEL
CHOL/HDL RATIO: 3.2 ratio (ref <5.0)
CHOLESTEROL: 214 mg/dL — AB (ref <200)
HDL: 67 mg/dL (ref >50)
LDL Cholesterol: 132 mg/dL — ABNORMAL HIGH (ref <100)
Triglycerides: 76 mg/dL (ref <150)
VLDL: 15 mg/dL (ref <30)

## 2016-07-31 LAB — TSH: TSH: 1.57 m[IU]/L

## 2016-07-31 LAB — CBC
HEMATOCRIT: 43.5 % (ref 35.0–45.0)
HEMOGLOBIN: 14 g/dL (ref 11.7–15.5)
MCH: 29.5 pg (ref 27.0–33.0)
MCHC: 32.2 g/dL (ref 32.0–36.0)
MCV: 91.6 fL (ref 80.0–100.0)
MPV: 9.9 fL (ref 7.5–12.5)
Platelets: 247 10*3/uL (ref 140–400)
RBC: 4.75 MIL/uL (ref 3.80–5.10)
RDW: 13 % (ref 11.0–15.0)
WBC: 4.8 10*3/uL (ref 3.8–10.8)

## 2016-07-31 LAB — COMPREHENSIVE METABOLIC PANEL
ALBUMIN: 4.3 g/dL (ref 3.6–5.1)
ALT: 19 U/L (ref 6–29)
AST: 17 U/L (ref 10–35)
Alkaline Phosphatase: 47 U/L (ref 33–130)
BILIRUBIN TOTAL: 0.6 mg/dL (ref 0.2–1.2)
BUN: 16 mg/dL (ref 7–25)
CALCIUM: 9.2 mg/dL (ref 8.6–10.4)
CO2: 25 mmol/L (ref 20–31)
CREATININE: 0.73 mg/dL (ref 0.50–1.05)
Chloride: 106 mmol/L (ref 98–110)
Glucose, Bld: 102 mg/dL — ABNORMAL HIGH (ref 65–99)
Potassium: 4.1 mmol/L (ref 3.5–5.3)
SODIUM: 139 mmol/L (ref 135–146)
TOTAL PROTEIN: 6.8 g/dL (ref 6.1–8.1)

## 2016-07-31 NOTE — Progress Notes (Signed)
Subjective:     Becky Hatfield is a 50 y.o. female G2P2 with BMI 25 who is here for a comprehensive physical exam. The patient reports no problems. She is sexually active without complaints. She uses BTL for contraception. She reports amenorrhea for the past 9-10 months. She has been experiencing some hot flushes and night sweats which are still manageable. She denies abnormal discharge or pelvic pain. She denies urinary incontinence  Past Medical History:  Diagnosis Date  . Renal disorder    Past Surgical History:  Procedure Laterality Date  . TUBAL LIGATION     Family History  Problem Relation Age of Onset  . Hypertension Mother   . Hypertension Father   . Breast cancer Neg Hx    Social History   Social History  . Marital status: Married    Spouse name: N/A  . Number of children: N/A  . Years of education: N/A   Occupational History  . Not on file.   Social History Main Topics  . Smoking status: Never Smoker  . Smokeless tobacco: Never Used  . Alcohol use No  . Drug use: No  . Sexual activity: Yes    Partners: Male    Birth control/ protection: Surgical   Other Topics Concern  . Not on file   Social History Narrative  . No narrative on file   Health Maintenance  Topic Date Due  . HIV Screening  03/16/1980  . TETANUS/TDAP  03/16/1984  . COLONOSCOPY  03/17/2015  . INFLUENZA VACCINE  03/05/2016  . MAMMOGRAM  08/23/2017  . PAP SMEAR  07/26/2018       Review of Systems Pertinent items are noted in HPI.   Objective:      Blood pressure 132/83, pulse 94, height 5\' 4"  (1.626 m), weight 148 lb (67.1 kg). GENERAL: Well-developed, well-nourished female in no acute distress.  HEENT: Normocephalic, atraumatic. Sclerae anicteric.  NECK: Supple. Normal thyroid.  LUNGS: Clear to auscultation bilaterally.  HEART: Regular rate and rhythm. BREASTS: Symmetric in size. No palpable masses or lymphadenopathy, skin changes, or nipple drainage. ABDOMEN: Soft, nontender,  nondistended. No organomegaly. PELVIC: Normal external female genitalia. Vagina is pink and rugated.  Normal discharge. Normal appearing cervix. Uterus is normal in size. No adnexal mass or tenderness. EXTREMITIES: No cyanosis, clubbing, or edema, 2+ distal pulses.    Assessment:    Healthy female exam.      Plan:    pap smear collected Patient had a normal mammogram in 08/2015 and is scheduled to have her next one Patient advised to continue monthly self breast and vulva exam Patient will be contacted with any abnormal results See After Visit Summary for Counseling Recommendations

## 2016-08-01 ENCOUNTER — Telehealth: Payer: Self-pay | Admitting: *Deleted

## 2016-08-01 ENCOUNTER — Other Ambulatory Visit: Payer: Self-pay | Admitting: Obstetrics and Gynecology

## 2016-08-01 DIAGNOSIS — Z1231 Encounter for screening mammogram for malignant neoplasm of breast: Secondary | ICD-10-CM

## 2016-08-01 LAB — LUTEINIZING HORMONE: LH: 37.5 m[IU]/mL

## 2016-08-01 LAB — CYTOLOGY - PAP: DIAGNOSIS: NEGATIVE

## 2016-08-01 LAB — FOLLICLE STIMULATING HORMONE: FSH: 54.3 m[IU]/mL

## 2016-08-01 NOTE — Telephone Encounter (Signed)
Called pt, no answer, left message to call the office.  

## 2016-08-01 NOTE — Telephone Encounter (Signed)
-----   Message from Mora Bellman, MD sent at 08/01/2016 10:36 AM EST ----- Please inform patient that hormones are in the menopausal range. She should still wait until 12 months of no period before celebrating being in menopause.  The rest of her labs are normal with the exception of mildly elevated cholesterol. She should consume a low fat, high fiber diet and exercise regularly at least 150 min/week to promote good heart health  Thanks  Vickii Chafe

## 2016-08-01 NOTE — Telephone Encounter (Signed)
Informed pt of results and recommendations, also informed her that urine cx and sureswab were still pending and we would call her when the results were available.

## 2016-08-11 LAB — SURESWAB, VAGINOSIS/VAGINITIS PLUS
ATOPOBIUM VAGINAE: NOT DETECTED Log (cells/mL)
C. ALBICANS, DNA: NOT DETECTED
C. TRACHOMATIS RNA, TMA: NOT DETECTED
C. glabrata, DNA: NOT DETECTED
C. parapsilosis, DNA: NOT DETECTED
C. tropicalis, DNA: NOT DETECTED
Gardnerella vaginalis: NOT DETECTED Log (cells/mL)
LACTOBACILLUS SPECIES: NOT DETECTED Log (cells/mL)
MEGASPHAERA SPECIES: NOT DETECTED Log (cells/mL)
N. GONORRHOEAE RNA, TMA: NOT DETECTED
T. vaginalis RNA, QL TMA: NOT DETECTED

## 2016-08-19 ENCOUNTER — Other Ambulatory Visit: Payer: BC Managed Care – PPO | Admitting: *Deleted

## 2016-08-19 DIAGNOSIS — Z01419 Encounter for gynecological examination (general) (routine) without abnormal findings: Secondary | ICD-10-CM

## 2016-08-19 NOTE — Progress Notes (Signed)
Pt here today for repeat urine culture, should have been sent at last office visit and there was an error and specimen never sent.  Urine cx sent to lab.

## 2016-08-20 LAB — URINE CULTURE

## 2016-09-03 ENCOUNTER — Encounter: Payer: Self-pay | Admitting: Radiology

## 2016-09-03 ENCOUNTER — Ambulatory Visit
Admission: RE | Admit: 2016-09-03 | Discharge: 2016-09-03 | Disposition: A | Discharge status: Home / Self Care | Payer: BC Managed Care – PPO | Source: Ambulatory Visit | Attending: Obstetrics and Gynecology | Admitting: Obstetrics and Gynecology

## 2016-09-03 DIAGNOSIS — Z1231 Encounter for screening mammogram for malignant neoplasm of breast: Secondary | ICD-10-CM | POA: Insufficient documentation

## 2016-09-03 DIAGNOSIS — R921 Mammographic calcification found on diagnostic imaging of breast: Secondary | ICD-10-CM | POA: Insufficient documentation

## 2016-09-05 ENCOUNTER — Other Ambulatory Visit: Payer: Self-pay | Admitting: Obstetrics and Gynecology

## 2016-09-05 DIAGNOSIS — R921 Mammographic calcification found on diagnostic imaging of breast: Secondary | ICD-10-CM

## 2016-09-05 DIAGNOSIS — R928 Other abnormal and inconclusive findings on diagnostic imaging of breast: Secondary | ICD-10-CM

## 2016-09-11 ENCOUNTER — Telehealth: Payer: Self-pay | Admitting: *Deleted

## 2016-09-11 DIAGNOSIS — B379 Candidiasis, unspecified: Secondary | ICD-10-CM

## 2016-09-11 MED ORDER — FLUCONAZOLE 150 MG PO TABS: 150.0000 mg | ORAL_TABLET | Freq: Once | ORAL | 0 refills | Status: AC

## 2016-09-11 NOTE — Telephone Encounter (Signed)
Pt was screened for yeast in December, cx was negative at that time.  Pt called the office stating she was having intense itching and burning and requesting medication to help with a yeast infection.  Sent Diflucan to the pharmacy and instructed to use OTC anti-itch cream to help with the symptoms.  Informed pt that if symptoms persist after treatment to schedule appt for follow-up, pt acknowledged instructions.

## 2016-10-04 ENCOUNTER — Ambulatory Visit
Admission: RE | Admit: 2016-10-04 | Discharge: 2016-10-04 | Disposition: A | Discharge status: Home / Self Care | Payer: BC Managed Care – PPO | Source: Ambulatory Visit | Attending: Obstetrics and Gynecology | Admitting: Obstetrics and Gynecology

## 2016-10-04 DIAGNOSIS — R928 Other abnormal and inconclusive findings on diagnostic imaging of breast: Secondary | ICD-10-CM

## 2016-10-04 DIAGNOSIS — R921 Mammographic calcification found on diagnostic imaging of breast: Secondary | ICD-10-CM | POA: Insufficient documentation

## 2016-10-07 ENCOUNTER — Other Ambulatory Visit: Payer: Self-pay | Admitting: Obstetrics and Gynecology

## 2016-10-07 DIAGNOSIS — R921 Mammographic calcification found on diagnostic imaging of breast: Secondary | ICD-10-CM

## 2016-10-07 DIAGNOSIS — R928 Other abnormal and inconclusive findings on diagnostic imaging of breast: Secondary | ICD-10-CM

## 2016-10-21 ENCOUNTER — Ambulatory Visit
Admission: RE | Admit: 2016-10-21 | Discharge: 2016-10-21 | Disposition: A | Discharge status: Home / Self Care | Payer: BC Managed Care – PPO | Source: Ambulatory Visit | Attending: Obstetrics and Gynecology | Admitting: Obstetrics and Gynecology

## 2016-10-21 DIAGNOSIS — R920 Mammographic microcalcification found on diagnostic imaging of breast: Secondary | ICD-10-CM | POA: Diagnosis not present

## 2016-10-21 DIAGNOSIS — R928 Other abnormal and inconclusive findings on diagnostic imaging of breast: Secondary | ICD-10-CM

## 2016-10-21 DIAGNOSIS — N62 Hypertrophy of breast: Secondary | ICD-10-CM | POA: Insufficient documentation

## 2016-10-21 DIAGNOSIS — D241 Benign neoplasm of right breast: Secondary | ICD-10-CM | POA: Diagnosis not present

## 2016-10-21 DIAGNOSIS — R921 Mammographic calcification found on diagnostic imaging of breast: Secondary | ICD-10-CM

## 2016-10-21 HISTORY — PX: BREAST BIOPSY: SHX20

## 2016-10-22 LAB — SURGICAL PATHOLOGY

## 2017-03-03 ENCOUNTER — Other Ambulatory Visit (INDEPENDENT_AMBULATORY_CARE_PROVIDER_SITE_OTHER): Payer: BC Managed Care – PPO | Admitting: *Deleted

## 2017-03-03 DIAGNOSIS — R300 Dysuria: Secondary | ICD-10-CM | POA: Diagnosis not present

## 2017-03-03 LAB — POCT URINALYSIS DIPSTICK
Bilirubin, UA: NEGATIVE
Glucose, UA: NEGATIVE
Ketones, UA: NEGATIVE
NITRITE UA: NEGATIVE
PH UA: 8 (ref 5.0–8.0)
PROTEIN UA: NEGATIVE
SPEC GRAV UA: 1.01 (ref 1.010–1.025)
UROBILINOGEN UA: 0.2 U/dL

## 2017-03-03 MED ORDER — PHENAZOPYRIDINE HCL 200 MG PO TABS: 200.0000 mg | ORAL_TABLET | Freq: Three times a day (TID) | ORAL | 0 refills | Status: DC | PRN

## 2017-03-03 MED ORDER — SULFAMETHOXAZOLE-TRIMETHOPRIM 800-160 MG PO TABS: 1.0000 | ORAL_TABLET | Freq: Two times a day (BID) | ORAL | 0 refills | Status: DC

## 2017-03-03 NOTE — Progress Notes (Signed)
SUBJECTIVE: Becky Hatfield is a 51 y.o. female who complains of urinary frequency, urgency and dysuria x 3 days, without flank pain, fever, chills, or abnormal vaginal discharge or bleeding.   OBJECTIVE: Appears well, in no apparent distress.  Vital signs are normal. Urine dipstick shows positive for leukocytes.    ASSESSMENT: Dysuria  PLAN: Treatment per orders.  Call or return to clinic prn if these symptoms worsen or fail to improve as anticipated. Septra and Pyridium sent to pharmacy.  Urine sent for culture.

## 2017-03-05 LAB — URINE CULTURE

## 2017-05-20 ENCOUNTER — Other Ambulatory Visit (INDEPENDENT_AMBULATORY_CARE_PROVIDER_SITE_OTHER): Payer: BC Managed Care – PPO | Admitting: *Deleted

## 2017-05-20 ENCOUNTER — Other Ambulatory Visit: Payer: BC Managed Care – PPO

## 2017-05-20 DIAGNOSIS — N390 Urinary tract infection, site not specified: Secondary | ICD-10-CM

## 2017-05-20 DIAGNOSIS — N898 Other specified noninflammatory disorders of vagina: Secondary | ICD-10-CM

## 2017-05-20 DIAGNOSIS — Z113 Encounter for screening for infections with a predominantly sexual mode of transmission: Secondary | ICD-10-CM | POA: Diagnosis not present

## 2017-05-20 DIAGNOSIS — B9620 Unspecified Escherichia coli [E. coli] as the cause of diseases classified elsewhere: Secondary | ICD-10-CM

## 2017-05-20 NOTE — Addendum Note (Signed)
Addended by: Gretchen Short on: 05/20/2017 04:45 PM   Modules accepted: Level of Service

## 2017-05-20 NOTE — Progress Notes (Signed)
SUBJECTIVE:  51 y.o. female complains of vaginal irritation for 4 days. Denies abnormal vaginal bleeding or significant pelvic pain or fever. No UTI symptoms. Denies history of known exposure to STD.  Patient's last menstrual period was 10/02/2015.  OBJECTIVE:  She appears well, afebrile.   ASSESSMENT:  Vaginal Irritation  PLAN:  GC, chlamydia, trichomonas, BVAG, CVAG probe sent to lab. Urine Culture sent to lab. Treatment: To be determined once lab results are received ROV prn if symptoms persist or worsen.

## 2017-05-21 NOTE — Progress Notes (Signed)
Agree with nursing staff's documentation of this patient's clinic encounter.  Verita Schneiders, MD

## 2017-05-22 LAB — CERVICOVAGINAL ANCILLARY ONLY
Bacterial vaginitis: NEGATIVE
CHLAMYDIA, DNA PROBE: NEGATIVE
Candida vaginitis: NEGATIVE
NEISSERIA GONORRHEA: NEGATIVE
Trichomonas: NEGATIVE

## 2017-05-26 LAB — CULTURE, URINE COMPREHENSIVE

## 2017-05-26 MED ORDER — CIPROFLOXACIN HCL 500 MG PO TABS: 500.0000 mg | ORAL_TABLET | Freq: Two times a day (BID) | ORAL | 0 refills | Status: DC

## 2017-05-26 NOTE — Addendum Note (Signed)
Addended by: Verita Schneiders A on: 05/26/2017 05:06 PM   Modules accepted: Orders

## 2017-06-09 ENCOUNTER — Telehealth: Payer: Self-pay

## 2017-06-09 NOTE — Telephone Encounter (Signed)
Call patient to follow up on a phone she left earlier. She did not leave message.

## 2017-06-24 ENCOUNTER — Other Ambulatory Visit (INDEPENDENT_AMBULATORY_CARE_PROVIDER_SITE_OTHER): Payer: BC Managed Care – PPO

## 2017-06-24 VITALS — BP 135/84 | HR 72

## 2017-06-24 DIAGNOSIS — R300 Dysuria: Secondary | ICD-10-CM

## 2017-06-24 NOTE — Addendum Note (Signed)
Addended by: Phillip Heal, DEMETRICE A on: 06/24/2017 04:35 PM   Modules accepted: Orders

## 2017-06-24 NOTE — Progress Notes (Addendum)
Patient presented to the office today for urine check. Patient reports having some dysuria and ongoing uti. She has a follow up appointment next week to discuss with a provider. But she wanted to check her urine before the Holidays. Urine was negative for infection. I have advised patient I will send for a culture to see if it grows anything.

## 2017-06-25 NOTE — Progress Notes (Signed)
Agree with nursing staff's documentation of this patient's clinic encounter.  Verita Schneiders, MD 06/25/2017 8:52 AM

## 2017-06-26 LAB — URINE CULTURE

## 2017-07-01 ENCOUNTER — Other Ambulatory Visit: Payer: Self-pay | Admitting: Obstetrics and Gynecology

## 2017-07-01 ENCOUNTER — Other Ambulatory Visit: Payer: Self-pay

## 2017-07-01 ENCOUNTER — Ambulatory Visit: Payer: BC Managed Care – PPO | Admitting: Obstetrics and Gynecology

## 2017-07-01 DIAGNOSIS — N390 Urinary tract infection, site not specified: Secondary | ICD-10-CM

## 2017-07-01 MED ORDER — CEPHALEXIN 500 MG PO CAPS: 500.0000 mg | ORAL_CAPSULE | Freq: Four times a day (QID) | ORAL | 0 refills | Status: DC

## 2017-07-01 NOTE — Telephone Encounter (Signed)
Call patient to discuss patient positive uti. Patient has been given rx for Keflex to help with her infection. Patient will be referred to [facility] for recurrent UTI.

## 2017-07-01 NOTE — Progress Notes (Signed)
Patient with recurrent UTI. Previously treated with Bactrim and Cipro. Rx Keflex provided and patient referred to see a Urologist  Patient was not seen by MD today

## 2017-07-02 ENCOUNTER — Telehealth: Payer: Self-pay

## 2017-07-02 ENCOUNTER — Encounter: Payer: Self-pay | Admitting: *Deleted

## 2017-07-02 DIAGNOSIS — N390 Urinary tract infection, site not specified: Secondary | ICD-10-CM | POA: Insufficient documentation

## 2017-07-02 NOTE — Progress Notes (Signed)
07/03/2017 10:53 AM   Verne Carrow May 13, 1966 [id number]  Referring provider: Mora Bellman, MD [address] [place], [place] [address]  No chief complaint on file.   HPI: 51 year old female who presents today for further evaluation of recurrent urinary tract infections.    She is been seen by the nurse multiple times at her OB/GYN's office for urinary symptoms.  She had numerous urine cultures over the past year including an E. coli urinary tract infection on 03/03/2017, E. coli on 05/20/2017, and beta hemolytic strep on 06/24/2017.  Her E. coli strains have been resistant to ampicillin.   Today, she reports that she has severe burning which is external to her bladder for the past 2 weeks.  This is worse just after voiding but is constant.  This is been going on for several days.  She denies any vaginal itching or discharge.  She denies urgency, frequency, gross hematuria.    She denies any history of sexually transmitted infections or possible exposures.  No pain with intercourse.  She is perimenopausal.  She did not have a period for over a year and then recently had one isolated menstrual cycle.  She does have a personal history of kidney stones.  She had a renal ultrasound on 09/05/2015 demonstrating a 6 mm right mid pole calculus as well as right hydronephrosis.  She was seen on the ED at this time, prescribed Flomax, and advised to follow-up with urology but never did so.   PMH: Past Medical History:  Diagnosis Date  . Kidney stones   . Renal disorder     Surgical History: Past Surgical History:  Procedure Laterality Date  . BREAST BIOPSY Right 10/21/2016   path pending  . TUBAL LIGATION      Home Medications:  Allergies as of 07/03/2017   No Known Allergies     Medication List        Accurate as of 07/03/17 11:59 PM. Always use your most recent med list.          cephALEXin 500 MG capsule Commonly known as:  KEFLEX Take 1 capsule (500 mg total)  by mouth 4 (four) times daily.   estradiol 0.1 MG/GM vaginal cream Commonly known as:  ESTRACE Place 1 g vaginally 3 (three) times a week.   fluconazole 150 MG tablet Commonly known as:  DIFLUCAN Take 1 tablet (150 mg total) by mouth once for 1 dose.       Allergies: No Known Allergies  Family History: Family History  Problem Relation Age of Onset  . Hypertension Father   . Hypertension Mother   . Breast cancer Neg Hx     Social History:  reports that  has never smoked. she has never used smokeless tobacco. She reports that she does not drink alcohol or use drugs.  ROS: UROLOGY Frequent Urination?: No Hard to postpone urination?: No Burning/pain with urination?: Yes Get up at night to urinate?: No Leakage of urine?: No Urine stream starts and stops?: No Trouble starting stream?: No Do you have to strain to urinate?: No Blood in urine?: No Urinary tract infection?: Yes Sexually transmitted disease?: No Injury to kidneys or bladder?: No Painful intercourse?: No Weak stream?: No Currently pregnant?: No Vaginal bleeding?: No Last menstrual period?: n  Gastrointestinal Nausea?: No Vomiting?: No Indigestion/heartburn?: Yes Diarrhea?: No Constipation?: No  Constitutional Fever: No Night sweats?: Yes Weight loss?: No Fatigue?: No  Skin Skin rash/lesions?: No Itching?: No  Eyes Blurred vision?: No Double vision?:  No  Ears/Nose/Throat Sore throat?: Yes Sinus problems?: Yes  Hematologic/Lymphatic Swollen glands?: No Easy bruising?: No  Cardiovascular Leg swelling?: No Chest pain?: No  Respiratory Cough?: Yes Shortness of breath?: No  Endocrine Excessive thirst?: No  Musculoskeletal Back pain?: No Joint pain?: No  Neurological Headaches?: Yes Dizziness?: No  Psychologic Depression?: No Anxiety?: No  Physical Exam: BP 115/74   Pulse 98   Ht 5\' 4"  (1.626 m)   Wt 145 lb (65.8 kg)   LMP 10/02/2015   BMI 24.89 kg/m     Constitutional:  Alert and oriented, No acute distress. HEENT: [place] AT, moist mucus membranes.  Trachea midline, no masses. Cardiovascular: No clubbing, cyanosis, or edema. Respiratory: Normal respiratory effort, no increased work of breathing. GI: Abdomen is soft, nontender, nondistended, no abdominal masses GU: No CVA tenderness. Pelvic: Normal external genitalia.  Urethral meatus is erythematous with?  Urethral carbuncle versus irritation.  Significant atrophic vaginitis is appreciated.  She has excoriation and erythema specifically on her internal right labia minora. Skin: No rashes, bruises or suspicious lesions. Lymph: No cervical or inguinal adenopathy. Neurologic: Grossly intact, no focal deficits, moving all 4 extremities. Psychiatric: Normal mood and affect.  Laboratory Data: Lab Results  Component Value Date   WBC 4.8 07/31/2016   HGB 14.0 07/31/2016   HCT 43.5 07/31/2016   MCV 91.6 07/31/2016   PLT 247 07/31/2016    Lab Results  Component Value Date   CREATININE 0.73 07/31/2016    Urinalysis Lab Results  Component Value Date   SPECGRAV 1.020 07/03/2017   PHUR 5.5 07/03/2017   COLORU Yellow 07/03/2017   APPEARANCEUR Clear 07/03/2017   LEUKOCYTESUR Negative 07/03/2017   PROTEINUR Negative 07/03/2017   GLUCOSEU Negative 07/03/2017   KETONESU Negative 07/03/2017   RBCU Negative 07/03/2017   BILIRUBINUR Negative 07/03/2017   UUROB 0.2 07/03/2017   NITRITE Negative 07/03/2017    Lab Results  Component Value Date   BACTERIA RARE (A) 09/05/2015    Pertinent Imaging: Results for orders placed during the hospital encounter of 09/05/15  US Renal   Narrative CLINICAL DATA:  Right flank pain for 1 day.  EXAM: RENAL / URINARY TRACT ULTRASOUND COMPLETE  COMPARISON:  Abdominal ultrasound 08/28/2013  FINDINGS: Right Kidney:  Length: 11.3 cm. Moderate hydronephrosis. Normal renal cortical thickness and echogenicity. No perinephric fluid. A midpole  calculus measuring 6.2 mm is noted.  Left Kidney:  Length: 10.2 cm. Normal renal cortical thickness and echogenicity without focal lesions or hydronephrosis.  Bladder:  Normal.  Bilateral ureteral jets are noted.  IMPRESSION: Moderate right-sided hydronephrosis possibly due to a partially obstructing right ureteral calculus.  Midpole right renal calculus measuring 6 mm.   Electronically Signed   By: Marijo Sanes M.D.   On: 09/05/2015 17:01    Ultrasound personally reviewed today.   Assessment & Plan:    1. Recurrent UTI Current symptoms of external burning are not likely related to cystitis and suspect beta-hemolytic strep is a contaminant UA today is unremarkable PVR minimal Recommend addition of probiotic and cranberry tablets in addition to below Advised to come to our office with any signs or symptoms of UTI for same-day visit - Urinalysis, Complete - BLADDER SCAN AMB NON-IMAGING  2. Atrophic vaginitis Right labial excoriation today ?  Severe atrophy versus candidal infection Recent STI testing 05/2017- Plan to treat with fluconazole to rule out yeast as underlying factor Advised to start topical estrogen cream, weekly at nighttime and then decrease to Monday Wednesday Friday Would like  to repeat pelvic exam in 4 weeks to ensure that this is improving/resolving If fails to resolve, she will need to be referred back to her GYN for further evaluation/possible biopsy  3. History of kidney stones History of nephrolithiasis, most recent ultrasound in 2017 with unilateral hydronephrosis from an obstructing stone without follow-up imaging Encourage repeat renal ultrasound to assess for any new stones or residual hydronephrosis - US RENAL; Future  Return in about 4 weeks (around 07/31/2017) for pelvic exam/ UA/ f/u RUS.  Hollice Espy, MD  [facility] [address], [place],  [address] [phone]   I spent 45 min  with this patient of which greater than 50% was spent in counseling and coordination of care with the patient.

## 2017-07-02 NOTE — Telephone Encounter (Signed)
Call patient to informed her of positive urine test results and that we will rx a medications to her current pharmacy. Patient will also be referred to urology since this problems has been going on for sometime now. Patient voice understanding.

## 2017-07-03 ENCOUNTER — Ambulatory Visit: Payer: BC Managed Care – PPO | Admitting: Urology

## 2017-07-03 ENCOUNTER — Telehealth: Payer: Self-pay

## 2017-07-03 ENCOUNTER — Encounter: Payer: Self-pay | Admitting: Urology

## 2017-07-03 VITALS — BP 115/74 | HR 98 | Ht 64.0 in | Wt 145.0 lb

## 2017-07-03 DIAGNOSIS — Z87442 Personal history of urinary calculi: Secondary | ICD-10-CM | POA: Diagnosis not present

## 2017-07-03 DIAGNOSIS — N952 Postmenopausal atrophic vaginitis: Secondary | ICD-10-CM

## 2017-07-03 DIAGNOSIS — N390 Urinary tract infection, site not specified: Secondary | ICD-10-CM

## 2017-07-03 LAB — URINALYSIS, COMPLETE
BILIRUBIN UA: NEGATIVE
GLUCOSE, UA: NEGATIVE
KETONES UA: NEGATIVE
Leukocytes, UA: NEGATIVE
Nitrite, UA: NEGATIVE
PROTEIN UA: NEGATIVE
RBC, UA: NEGATIVE
Specific Gravity, UA: 1.02 (ref 1.005–1.030)
Urobilinogen, Ur: 0.2 mg/dL (ref 0.2–1.0)
pH, UA: 5.5 (ref 5.0–7.5)

## 2017-07-03 MED ORDER — ESTRADIOL 0.1 MG/GM VA CREA: 1.0000 g | TOPICAL_CREAM | VAGINAL | 12 refills | Status: DC

## 2017-07-03 MED ORDER — FLUCONAZOLE 150 MG PO TABS: 150.0000 mg | ORAL_TABLET | Freq: Once | ORAL | 0 refills | Status: AC

## 2017-07-03 NOTE — Telephone Encounter (Signed)
Can you please see about calling in compounded topical estrogen cream?  I believe we use [facility].

## 2017-07-03 NOTE — Telephone Encounter (Signed)
Pt called back stating that estrogen cream cost to much. Pt inquired about another medication. Please advise.

## 2017-07-07 NOTE — Telephone Encounter (Signed)
Estradiol called into [facility].

## 2017-07-10 ENCOUNTER — Encounter: Payer: Self-pay | Admitting: Urology

## 2017-07-14 ENCOUNTER — Ambulatory Visit: Admission: RE | Admit: 2017-07-14 | Payer: BC Managed Care – PPO | Source: Ambulatory Visit

## 2017-07-18 ENCOUNTER — Ambulatory Visit
Admission: RE | Admit: 2017-07-18 | Discharge: 2017-07-18 | Disposition: A | Discharge status: Home / Self Care | Payer: BC Managed Care – PPO | Source: Ambulatory Visit | Attending: Urology | Admitting: Urology

## 2017-07-18 DIAGNOSIS — N200 Calculus of kidney: Secondary | ICD-10-CM | POA: Diagnosis not present

## 2017-07-18 DIAGNOSIS — Z87442 Personal history of urinary calculi: Secondary | ICD-10-CM

## 2017-07-25 ENCOUNTER — Ambulatory Visit: Payer: BC Managed Care – PPO | Admitting: Urology

## 2017-07-31 ENCOUNTER — Ambulatory Visit: Payer: Self-pay

## 2017-08-08 ENCOUNTER — Ambulatory Visit (INDEPENDENT_AMBULATORY_CARE_PROVIDER_SITE_OTHER): Payer: BC Managed Care – PPO | Admitting: Urology

## 2017-08-08 ENCOUNTER — Other Ambulatory Visit: Payer: Self-pay

## 2017-08-08 ENCOUNTER — Other Ambulatory Visit
Admission: RE | Admit: 2017-08-08 | Discharge: 2017-08-08 | Disposition: A | Discharge status: Home / Self Care | Payer: BC Managed Care – PPO | Source: Ambulatory Visit | Attending: Urology | Admitting: Urology

## 2017-08-08 ENCOUNTER — Encounter: Payer: Self-pay | Admitting: Urology

## 2017-08-08 VITALS — BP 152/81 | HR 86 | Ht 64.0 in | Wt 145.0 lb

## 2017-08-08 DIAGNOSIS — N200 Calculus of kidney: Secondary | ICD-10-CM

## 2017-08-08 DIAGNOSIS — N390 Urinary tract infection, site not specified: Secondary | ICD-10-CM

## 2017-08-08 DIAGNOSIS — L309 Dermatitis, unspecified: Secondary | ICD-10-CM | POA: Diagnosis not present

## 2017-08-08 DIAGNOSIS — N952 Postmenopausal atrophic vaginitis: Secondary | ICD-10-CM

## 2017-08-08 LAB — URINALYSIS, COMPLETE (UACMP) WITH MICROSCOPIC
Bacteria, UA: NONE SEEN
Bilirubin Urine: NEGATIVE
Glucose, UA: NEGATIVE mg/dL
Ketones, ur: NEGATIVE mg/dL
Leukocytes, UA: NEGATIVE
Nitrite: NEGATIVE
Protein, ur: NEGATIVE mg/dL
SPECIFIC GRAVITY, URINE: 1.01 (ref 1.005–1.030)
pH: 6 (ref 5.0–8.0)

## 2017-08-08 NOTE — Progress Notes (Signed)
08/08/2017 9:02 AM   Verne Carrow [birth year] [id number]  Referring provider: Maryland Pink, MD [address] [facility] [place], [place] [address]  Chief Complaint  Patient presents with  . Recurrent UTI    1 month    HPI: 52 year old female who returns today for one-month follow-up.  She was initially seen on 07/03/2017 for further evaluation of urinary tract infections.  At that time, her symptoms are primarily external burning on examination, she was noted to have significant vulvar, vaginal, and periurethral erythema with particular erythema and excoriation of her right inner labial minora.  There was not evidence of UTI.  She was prescribed Premarin cream to be used weekly initially and then 3 times a week thereafter with close follow-up.  She notes that after the first 1-2 weeks, she had near complete resolution of all of her symptoms and the erythema resolved.  Over the past week or so, she is developed recurrent irritation of her right labia with severe discomfort and excoriation.  When she tried to use the Premarin compounded cream, this was very uncomfortable and felt like fire.  She has not used it since.  She denies any urinary symptoms today including dysuria.    She also has a interval renal ultrasound today for follow-up of history of kidney stones.  She has 2 small 3 mm lower pole stones bilaterally, otherwise no signal process.  She denies any flank pain but does have occasional right lower quadrant pain but specifically related to menstruation.   PMH: Past Medical History:  Diagnosis Date  . Kidney stones   . Renal disorder     Surgical History: Past Surgical History:  Procedure Laterality Date  . BREAST BIOPSY Right 10/21/2016   path pending  . TUBAL LIGATION      Home Medications:  Allergies as of 08/08/2017   No Known Allergies     Medication List        Accurate as of 08/08/17 11:59 PM. Always use your most recent med list.          estradiol 0.1 MG/GM vaginal cream Commonly known as:  ESTRACE Place 1 g vaginally 3 (three) times a week.       Allergies: No Known Allergies  Family History: Family History  Problem Relation Age of Onset  . Hypertension Father   . Hypertension Mother   . Breast cancer Neg Hx     Social History:  reports that  has never smoked. she has never used smokeless tobacco. She reports that she does not drink alcohol or use drugs.  ROS: UROLOGY Frequent Urination?: No Hard to postpone urination?: No Burning/pain with urination?: No Get up at night to urinate?: No Leakage of urine?: No Urine stream starts and stops?: No Trouble starting stream?: No Do you have to strain to urinate?: No Blood in urine?: No Urinary tract infection?: No Sexually transmitted disease?: No Injury to kidneys or bladder?: No Painful intercourse?: No Weak stream?: No Currently pregnant?: No Vaginal bleeding?: No Last menstrual period?: n  Gastrointestinal Nausea?: No Vomiting?: No Indigestion/heartburn?: No Diarrhea?: No Constipation?: No  Constitutional Fever: No Night sweats?: No Weight loss?: No Fatigue?: No  Skin Skin rash/lesions?: No Itching?: No  Eyes Blurred vision?: No Double vision?: No  Ears/Nose/Throat Sore throat?: No Sinus problems?: No  Hematologic/Lymphatic Swollen glands?: No Easy bruising?: No  Cardiovascular Leg swelling?: No Chest pain?: No  Respiratory Cough?: No Shortness of breath?: No  Endocrine Excessive thirst?: No  Musculoskeletal  Back pain?: No Joint pain?: No  Neurological Headaches?: No Dizziness?: No  Psychologic Depression?: No Anxiety?: No  Physical Exam: BP (!) 152/81   Pulse 86   Ht 5\' 4"  (1.626 m)   Wt 145 lb (65.8 kg)   LMP 10/02/2015   BMI 24.89 kg/m   Constitutional:  Alert and oriented, No acute distress. HEENT: [place] AT, moist mucus membranes.  Trachea midline, no masses. Cardiovascular: No clubbing, cyanosis, or  edema. Respiratory: Normal respiratory effort, no increased work of breathing. GI: Abdomen is soft, nontender, nondistended, Pelvic: Interval resolution of all vaginal and periurethral erythema.  Urethral meatus is now normal without carbuncle or any other growths appreciated.  She continues to have a small excoriated lesion on her right labia minora with some flakiness and erythema. Skin: No rashes, bruises or suspicious lesions. Neurologic: Grossly intact, no focal deficits, moving all 4 extremities. Psychiatric: Normal mood and affect.  Laboratory Data: Lab Results  Component Value Date   WBC 4.8 07/31/2016   HGB 14.0 07/31/2016   HCT 43.5 07/31/2016   MCV 91.6 07/31/2016   PLT 247 07/31/2016    Lab Results  Component Value Date   CREATININE 0.73 07/31/2016    Urinalysis Results for orders placed or performed in visit on 07/03/17  Urinalysis, Complete  Result Value Ref Range   Specific Gravity, UA 1.020 1.005 - 1.030   pH, UA 5.5 5.0 - 7.5   Color, UA Yellow Yellow   Appearance Ur Clear Clear   Leukocytes, UA Negative Negative   Protein, UA Negative Negative/Trace   Glucose, UA Negative Negative   Ketones, UA Negative Negative   RBC, UA Negative Negative   Bilirubin, UA Negative Negative   Urobilinogen, Ur 0.2 0.2 - 1.0 mg/dL   Nitrite, UA Negative Negative     Pertinent Imaging: Results for orders placed during the hospital encounter of 07/18/17  US RENAL   Narrative CLINICAL DATA:  History kidney stones  EXAM: RENAL / URINARY TRACT ULTRASOUND COMPLETE  COMPARISON:  09/05/2015  FINDINGS: Right Kidney:  Length: 9.5 cm. Shadowing 3 mm nonobstructing stone in the midpole. Echogenicity within normal limits. No mass or hydronephrosis visualized.  Left Kidney:  Length: 10.9 cm. Shadowing 3 mm nonobstructing stone in the mid to lower pole. Echogenicity within normal limits. No mass or hydronephrosis visualized.  Bladder:  Appears normal for degree of  bladder distention.  IMPRESSION: Bilateral nephrolithiasis.  No hydronephrosis.   Electronically Signed   By: Rolm Baptise M.D.   On: 07/18/2017 16:20    RUS personally reviewed today  Assessment & Plan:    1. Vulvar dermatitis Persistent lesion/ dermatitis involving the right labia which is concerning Dramatic improvement of vaginitis and periurethral erythema with estrogen cream Strongly reocmmend follow up ob-gyn --> may need biopsy, patient understands importance of prompt follow up   2. Atrophic vaginitis Continue estrogen cream as tolerated M-W-Fr  3. Nephrolithiasis Small bilateral nephrolithiasis We discussed general stone prevention techniques including drinking plenty water with goal of producing 2.5 L urine daily, increased citric acid intake, avoidance of high oxalate containing foods, and decreased salt intake.  Information about dietary recommendations given today.   Return in about 1 year (around 08/08/2018) for KUB. or sooner as needed  Hollice Espy, MD  [facility] [address], [place], [place] [address] [phone]  I spent 25 min with this patient of which greater than 50% was spent in counseling and coordination of care with the patient.

## 2017-08-09 ENCOUNTER — Encounter: Payer: Self-pay | Admitting: Urology

## 2017-08-12 ENCOUNTER — Ambulatory Visit (INDEPENDENT_AMBULATORY_CARE_PROVIDER_SITE_OTHER): Payer: BC Managed Care – PPO | Admitting: Obstetrics and Gynecology

## 2017-08-12 ENCOUNTER — Ambulatory Visit: Payer: BC Managed Care – PPO | Admitting: Obstetrics and Gynecology

## 2017-08-12 ENCOUNTER — Encounter: Payer: Self-pay | Admitting: Obstetrics and Gynecology

## 2017-08-12 VITALS — BP 133/76 | HR 80 | Ht 64.0 in | Wt 152.1 lb

## 2017-08-12 DIAGNOSIS — N760 Acute vaginitis: Secondary | ICD-10-CM | POA: Diagnosis not present

## 2017-08-12 DIAGNOSIS — N898 Other specified noninflammatory disorders of vagina: Secondary | ICD-10-CM | POA: Diagnosis not present

## 2017-08-12 MED ORDER — CLOBETASOL PROPIONATE 0.05 % EX OINT: TOPICAL_OINTMENT | CUTANEOUS | 5 refills | Status: DC

## 2017-08-12 MED ORDER — FLUCONAZOLE 150 MG PO TABS: 150.0000 mg | ORAL_TABLET | Freq: Once | ORAL | 0 refills | Status: AC

## 2017-08-12 NOTE — Progress Notes (Signed)
52 yo G2P2003 with chronic vulvovaginitis. Patient reports persistent vulva irritation, initially thought to be a UTI. She was prescribed premarin cream and reports improvement in her symptoms initially. However, despite the use of the cream 3 times weekly, she reports continued vaginal pruritis. She remains perimenopausal with her last period in June   Past Medical History:  Diagnosis Date  . Kidney stones   . Renal disorder    Past Surgical History:  Procedure Laterality Date  . BREAST BIOPSY Right 10/21/2016   path pending  . TUBAL LIGATION     Family History  Problem Relation Age of Onset  . Hypertension Father   . Hypertension Mother   . Breast cancer Neg Hx    Social History   Tobacco Use  . Smoking status: Never Smoker  . Smokeless tobacco: Never Used  Substance Use Topics  . Alcohol use: No    Alcohol/week: 0.0 oz  . Drug use: No   ROS See pertinent in HPI  Blood pressure 133/76, pulse 80, height 5\' 4"  (1.626 m), weight 152 lb 1.6 oz (69 kg), last menstrual period 10/02/2015. GENERAL: Well-developed, well-nourished female in no acute distress.  ABDOMEN: Soft, nontender, nondistended. No organomegaly. PELVIC: Normal external female genitalia with 1 cm white patch on right labia majora, mild tenderness to touch. Vagina is pale pink and rugated.  Normal discharge. Normal appearing cervix. Uterus is normal in size. No adnexal mass or tenderness. EXTREMITIES: No cyanosis, clubbing, or edema, 2+ distal pulses.  A/P 52 yo with chronic vulvovaginits - Discussed benefits of skin biopsy given the persistence of her symptoms Procedure: After injection of 3 cc 1%lidocaine, a 5 mm area of skin was carefully excised using a punch biopsy. Patient tolerated the procedure well. The skin was reapproximated with 3.0 vicryl. 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

## 2017-08-12 NOTE — Progress Notes (Signed)
Pt c/o R labia irritation. No relief using Estrace. Premarin was too expensive per pt.

## 2017-08-19 ENCOUNTER — Encounter: Payer: Self-pay | Admitting: *Deleted

## 2017-09-08 ENCOUNTER — Encounter: Payer: Self-pay | Admitting: Obstetrics and Gynecology

## 2017-09-08 ENCOUNTER — Ambulatory Visit (INDEPENDENT_AMBULATORY_CARE_PROVIDER_SITE_OTHER): Payer: BC Managed Care – PPO | Admitting: Obstetrics and Gynecology

## 2017-09-08 ENCOUNTER — Other Ambulatory Visit: Payer: Self-pay

## 2017-09-08 VITALS — BP 126/78 | HR 71 | Ht 64.0 in | Wt 153.0 lb

## 2017-09-08 DIAGNOSIS — Z1151 Encounter for screening for human papillomavirus (HPV): Secondary | ICD-10-CM

## 2017-09-08 DIAGNOSIS — Z124 Encounter for screening for malignant neoplasm of cervix: Secondary | ICD-10-CM | POA: Diagnosis not present

## 2017-09-08 DIAGNOSIS — Z01419 Encounter for gynecological examination (general) (routine) without abnormal findings: Secondary | ICD-10-CM

## 2017-09-08 NOTE — Progress Notes (Signed)
Presents for AEX/PAP 

## 2017-09-08 NOTE — Progress Notes (Signed)
Subjective:     Becky Hatfield is a 52 y.o. female G2P3 with LMP 01/2017 and BMI 26 who is here for a comprehensive physical exam. The patient reports no problems. She reports significant improvement in her vulva pruritis with the regular use of clobetasol cream. She denies any vaginal bleeding since June. She does experience vasomotor symptoms but they are occasional and manageable. She is sexually active without complaints. She denies any urinary incontinence.   Past Medical History:  Diagnosis Date  . Kidney stones   . Renal disorder    Past Surgical History:  Procedure Laterality Date  . BREAST BIOPSY Right 10/21/2016   path pending  . TUBAL LIGATION     Family History  Problem Relation Age of Onset  . Hypertension Father   . Hypertension Mother   . Breast cancer Neg Hx     Social History   Socioeconomic History  . Marital status: Married    Spouse name: Not on file  . Number of children: Not on file  . Years of education: Not on file  . Highest education level: Not on file  Social Needs  . Financial resource strain: Not on file  . Food insecurity - worry: Not on file  . Food insecurity - inability: Not on file  . Transportation needs - medical: Not on file  . Transportation needs - non-medical: Not on file  Occupational History  . Not on file  Tobacco Use  . Smoking status: Never Smoker  . Smokeless tobacco: Never Used  Substance and Sexual Activity  . Alcohol use: No    Alcohol/week: 0.0 oz  . Drug use: No  . Sexual activity: Yes    Partners: Male    Birth control/protection: Surgical  Other Topics Concern  . Not on file  Social History Narrative  . Not on file   Health Maintenance  Topic Date Due  . HIV Screening  03/16/1980  . TETANUS/TDAP  03/16/1984  . COLONOSCOPY  03/17/2015  . INFLUENZA VACCINE  03/05/2017  . MAMMOGRAM  10/05/2018  . PAP SMEAR  08/01/2019       Review of Systems Pertinent items are noted in HPI.   Objective:  Blood  pressure 126/78, pulse 71, height 5\' 4"  (1.626 m), weight 153 lb (69.4 kg), last menstrual period 10/02/2015.     GENERAL: Well-developed, well-nourished female in no acute distress.  HEENT: Normocephalic, atraumatic. Sclerae anicteric.  NECK: Supple. Normal thyroid.  LUNGS: Clear to auscultation bilaterally.  HEART: Regular rate and rhythm. BREASTS: Symmetric in size. No palpable masses or lymphadenopathy, skin changes, or nipple drainage. ABDOMEN: Soft, nontender, nondistended. No organomegaly. PELVIC: Normal external female genitalia, no evidence of vulva irritation. Vagina is pink and rugated.  Normal discharge. Normal appearing cervix. Uterus is normal in size. No adnexal mass or tenderness. EXTREMITIES: No cyanosis, clubbing, or edema, 2+ distal pulses.    Assessment:    Healthy female exam.      Plan:    pap smear collected Patient due for screening mammogram in March Fasting labs collected Patient will be contacted with abnormal results  See After Visit Summary for Counseling Recommendations

## 2017-09-09 LAB — COMPREHENSIVE METABOLIC PANEL
A/G RATIO: 1.7 (ref 1.2–2.2)
ALT: 19 IU/L (ref 0–32)
AST: 18 IU/L (ref 0–40)
Albumin: 4.6 g/dL (ref 3.5–5.5)
Alkaline Phosphatase: 51 IU/L (ref 39–117)
BUN/Creatinine Ratio: 21 (ref 9–23)
BUN: 14 mg/dL (ref 6–24)
Bilirubin Total: 0.5 mg/dL (ref 0.0–1.2)
CALCIUM: 9.6 mg/dL (ref 8.7–10.2)
CO2: 23 mmol/L (ref 20–29)
Chloride: 102 mmol/L (ref 96–106)
Creatinine, Ser: 0.68 mg/dL (ref 0.57–1.00)
GFR calc Af Amer: 116 mL/min/{1.73_m2} (ref >59)
GFR, EST NON AFRICAN AMERICAN: 101 mL/min/{1.73_m2} (ref >59)
GLUCOSE: 89 mg/dL (ref 65–99)
Globulin, Total: 2.7 g/dL (ref 1.5–4.5)
POTASSIUM: 4.8 mmol/L (ref 3.5–5.2)
Sodium: 139 mmol/L (ref 134–144)
Total Protein: 7.3 g/dL (ref 6.0–8.5)

## 2017-09-09 LAB — HEMOGLOBIN A1C
ESTIMATED AVERAGE GLUCOSE: 114 mg/dL
Hgb A1c MFr Bld: 5.6 % (ref 4.8–5.6)

## 2017-09-09 LAB — LIPID PANEL
CHOLESTEROL TOTAL: 223 mg/dL — AB (ref 100–199)
Chol/HDL Ratio: 3.1 ratio (ref 0.0–4.4)
HDL: 72 mg/dL (ref >39)
LDL Calculated: 140 mg/dL — ABNORMAL HIGH (ref 0–99)
Triglycerides: 57 mg/dL (ref 0–149)
VLDL CHOLESTEROL CAL: 11 mg/dL (ref 5–40)

## 2017-09-09 LAB — CBC
Hematocrit: 44.6 % (ref 34.0–46.6)
Hemoglobin: 14.9 g/dL (ref 11.1–15.9)
MCH: 30.2 pg (ref 26.6–33.0)
MCHC: 33.4 g/dL (ref 31.5–35.7)
MCV: 91 fL (ref 79–97)
PLATELETS: 241 10*3/uL (ref 150–379)
RBC: 4.93 x10E6/uL (ref 3.77–5.28)
RDW: 13.4 % (ref 12.3–15.4)
WBC: 4.3 10*3/uL (ref 3.4–10.8)

## 2017-09-09 LAB — TSH: TSH: 1.41 u[IU]/mL (ref 0.450–4.500)

## 2017-09-10 LAB — CYTOLOGY - PAP
DIAGNOSIS: NEGATIVE
HPV: NOT DETECTED

## 2018-11-02 ENCOUNTER — Other Ambulatory Visit: Payer: Self-pay | Admitting: *Deleted

## 2018-11-02 ENCOUNTER — Other Ambulatory Visit: Payer: Self-pay | Admitting: Obstetrics and Gynecology

## 2018-11-02 MED ORDER — CLOBETASOL PROPIONATE 0.05 % EX OINT: TOPICAL_OINTMENT | CUTANEOUS | 2 refills | Status: DC

## 2019-04-08 ENCOUNTER — Other Ambulatory Visit: Payer: Self-pay

## 2019-04-08 ENCOUNTER — Encounter: Payer: Self-pay | Admitting: Obstetrics and Gynecology

## 2019-04-08 ENCOUNTER — Ambulatory Visit (INDEPENDENT_AMBULATORY_CARE_PROVIDER_SITE_OTHER): Payer: BC Managed Care – PPO | Admitting: Obstetrics and Gynecology

## 2019-04-08 VITALS — BP 127/74 | HR 107 | Temp 98.3°F | Ht 64.0 in | Wt 163.7 lb

## 2019-04-08 DIAGNOSIS — Z01419 Encounter for gynecological examination (general) (routine) without abnormal findings: Secondary | ICD-10-CM | POA: Diagnosis not present

## 2019-04-08 DIAGNOSIS — Z124 Encounter for screening for malignant neoplasm of cervix: Secondary | ICD-10-CM

## 2019-04-08 NOTE — Progress Notes (Signed)
Subjective:     Becky Hatfield is a 53 y.o. female P3 postmenopausal with BMI 28 who is here for a comprehensive physical exam. The patient reports no problems. Patient is sexually active without complaints. She denies pelvic pain or abnormal discharge. She denies urinary incontinence. Patient denies significant vasomotor symptoms. Patient continues to use clobetasol cream for lichen sclerosis.  Past Medical History:  Diagnosis Date  . Kidney stones   . Renal disorder    Past Surgical History:  Procedure Laterality Date  . BREAST BIOPSY Right 10/21/2016   path pending  . TUBAL LIGATION     Family History  Problem Relation Age of Onset  . Hypertension Father   . Hypertension Mother   . Breast cancer Neg Hx     Social History   Socioeconomic History  . Marital status: Married    Spouse name: Not on file  . Number of children: Not on file  . Years of education: Not on file  . Highest education level: Not on file  Occupational History  . Not on file  Social Needs  . Financial resource strain: Not on file  . Food insecurity    Worry: Not on file    Inability: Not on file  . Transportation needs    Medical: Not on file    Non-medical: Not on file  Tobacco Use  . Smoking status: Never Smoker  . Smokeless tobacco: Never Used  Substance and Sexual Activity  . Alcohol use: No    Alcohol/week: 0.0 standard drinks  . Drug use: No  . Sexual activity: Yes    Partners: Male    Birth control/protection: Surgical  Lifestyle  . Physical activity    Days per week: Not on file    Minutes per session: Not on file  . Stress: Not on file  Relationships  . Social Herbalist on phone: Not on file    Gets together: Not on file    Attends religious service: Not on file    Active member of club or organization: Not on file    Attends meetings of clubs or organizations: Not on file    Relationship status: Not on file  . Intimate partner violence    Fear of current or ex  partner: Not on file    Emotionally abused: Not on file    Physically abused: Not on file    Forced sexual activity: Not on file  Other Topics Concern  . Not on file  Social History Narrative  . Not on file   Health Maintenance  Topic Date Due  . HIV Screening  03/16/1980  . TETANUS/TDAP  03/16/1984  . COLONOSCOPY  03/17/2015  . MAMMOGRAM  10/05/2018  . INFLUENZA VACCINE  03/06/2019  . PAP SMEAR-Modifier  09/08/2020       Review of Systems Pertinent items are noted in HPI.   Objective:  Blood pressure 127/74, pulse (!) 107, temperature 98.3 F (36.8 C), height 5\' 4"  (1.626 m), weight 163 lb 11.2 oz (74.3 kg), last menstrual period 10/02/2015.     GENERAL: Well-developed, well-nourished female in no acute distress.  HEENT: Normocephalic, atraumatic. Sclerae anicteric.  NECK: Supple. Normal thyroid.  LUNGS: Clear to auscultation bilaterally.  HEART: Regular rate and rhythm. BREASTS: Symmetric in size. No palpable masses or lymphadenopathy, skin changes, or nipple drainage. ABDOMEN: Soft, nontender, nondistended. No organomegaly. PELVIC: Normal external female genitalia. Vagina is pale pink and rugated with a small 0.5 cm area consistent  with lichen sclerosis .  Normal discharge. Normal appearing cervix. Uterus is normal in size. No adnexal mass or tenderness. EXTREMITIES: No cyanosis, clubbing, or edema, 2+ distal pulses.    Assessment:    Healthy female exam.      Plan:    pap smear collected Screening mammogram ordered Continue clobetasol cream for lichen sclerosis Patient needs lipid panel in fasting state and plans to follow up with PCP in [facility] Patient will be contacted with abnormal results See After Visit Summary for Counseling Recommendations

## 2019-04-08 NOTE — Progress Notes (Signed)
GYN presents for AEX.  Last PAP 09/10/2017.  Will need refill on Temovate.  Reports no problems today.

## 2019-04-13 LAB — CYTOLOGY - PAP: Diagnosis: NEGATIVE

## 2019-05-20 ENCOUNTER — Telehealth: Payer: Self-pay | Admitting: Family Medicine

## 2019-05-20 NOTE — Telephone Encounter (Signed)
Pt is requesting to see if Dr. Lorelei Pont will see her and her daughter. He sees her husband Loletha Grayer and one of her twin daughters. Please advise.

## 2019-05-21 NOTE — Telephone Encounter (Signed)
At this point in my career, I am not taking any primary care patients and the majority of my practice is sports medicine.  We have had a lot of turnover, but it is ok for anyone in the front office to decline for me without asking.

## 2019-05-21 NOTE — Telephone Encounter (Signed)
Called pt and explained Dr. Lorelei Pont is not excepting any new patients and she said that is fine. She was just wondering and she said that she will call back at the first of the year and schedule with Dr. Einar Pheasant. She didn't want to make the appointment today.

## 2019-08-11 ENCOUNTER — Ambulatory Visit
Admission: RE | Admit: 2019-08-11 | Discharge: 2019-08-11 | Disposition: A | Discharge status: Home / Self Care | Payer: BC Managed Care – PPO | Source: Ambulatory Visit | Attending: Obstetrics and Gynecology | Admitting: Obstetrics and Gynecology

## 2019-08-11 DIAGNOSIS — Z1231 Encounter for screening mammogram for malignant neoplasm of breast: Secondary | ICD-10-CM | POA: Diagnosis not present

## 2019-08-11 DIAGNOSIS — Z01419 Encounter for gynecological examination (general) (routine) without abnormal findings: Secondary | ICD-10-CM | POA: Diagnosis present

## 2019-10-17 ENCOUNTER — Emergency Department: Payer: BC Managed Care – PPO

## 2019-10-17 ENCOUNTER — Other Ambulatory Visit: Payer: Self-pay

## 2019-10-17 ENCOUNTER — Emergency Department
Admission: EM | Admit: 2019-10-17 | Discharge: 2019-10-17 | Disposition: A | Discharge status: Home / Self Care | Payer: BC Managed Care – PPO | Attending: Emergency Medicine | Admitting: Emergency Medicine

## 2019-10-17 DIAGNOSIS — R1010 Upper abdominal pain, unspecified: Secondary | ICD-10-CM

## 2019-10-17 DIAGNOSIS — R1011 Right upper quadrant pain: Secondary | ICD-10-CM | POA: Diagnosis not present

## 2019-10-17 DIAGNOSIS — R1031 Right lower quadrant pain: Secondary | ICD-10-CM | POA: Insufficient documentation

## 2019-10-17 LAB — CBC
HCT: 42.1 % (ref 36.0–46.0)
Hemoglobin: 13.7 g/dL (ref 12.0–15.0)
MCH: 29.7 pg (ref 26.0–34.0)
MCHC: 32.5 g/dL (ref 30.0–36.0)
MCV: 91.3 fL (ref 80.0–100.0)
Platelets: 257 10*3/uL (ref 150–400)
RBC: 4.61 MIL/uL (ref 3.87–5.11)
RDW: 12.6 % (ref 11.5–15.5)
WBC: 6 10*3/uL (ref 4.0–10.5)
nRBC: 0 % (ref 0.0–0.2)

## 2019-10-17 LAB — COMPREHENSIVE METABOLIC PANEL
ALT: 19 U/L (ref 0–44)
AST: 19 U/L (ref 15–41)
Albumin: 4 g/dL (ref 3.5–5.0)
Alkaline Phosphatase: 47 U/L (ref 38–126)
Anion gap: 6 (ref 5–15)
BUN: 17 mg/dL (ref 6–20)
CO2: 26 mmol/L (ref 22–32)
Calcium: 9 mg/dL (ref 8.9–10.3)
Chloride: 106 mmol/L (ref 98–111)
Creatinine, Ser: 0.88 mg/dL (ref 0.44–1.00)
GFR calc Af Amer: >60 mL/min (ref >60)
GFR calc non Af Amer: >60 mL/min (ref >60)
Glucose, Bld: 93 mg/dL (ref 70–99)
Potassium: 3.9 mmol/L (ref 3.5–5.1)
Sodium: 138 mmol/L (ref 135–145)
Total Bilirubin: 0.5 mg/dL (ref 0.3–1.2)
Total Protein: 7.1 g/dL (ref 6.5–8.1)

## 2019-10-17 LAB — URINALYSIS, COMPLETE (UACMP) WITH MICROSCOPIC
Bacteria, UA: NONE SEEN
Bilirubin Urine: NEGATIVE
Glucose, UA: NEGATIVE mg/dL
Hgb urine dipstick: NEGATIVE
Ketones, ur: NEGATIVE mg/dL
Nitrite: NEGATIVE
Protein, ur: NEGATIVE mg/dL
Specific Gravity, Urine: 1.019 (ref 1.005–1.030)
pH: 7 (ref 5.0–8.0)

## 2019-10-17 LAB — LIPASE, BLOOD: Lipase: 35 U/L (ref 11–51)

## 2019-10-17 MED ORDER — PANTOPRAZOLE SODIUM 20 MG PO TBEC: 20.0000 mg | DELAYED_RELEASE_TABLET | Freq: Every day | ORAL | 1 refills | Status: DC

## 2019-10-17 MED ORDER — MORPHINE SULFATE (PF) 4 MG/ML IV SOLN
4.0000 mg | Freq: Once | INTRAVENOUS | Status: AC
  Administered 2019-10-17: 4 mg via INTRAVENOUS
  Filled 2019-10-17: qty 1

## 2019-10-17 MED ORDER — SUCRALFATE 1 G PO TABS: 1.0000 g | ORAL_TABLET | Freq: Four times a day (QID) | ORAL | 0 refills | Status: DC

## 2019-10-17 MED ORDER — IOHEXOL 300 MG/ML  SOLN
100.0000 mL | Freq: Once | INTRAMUSCULAR | Status: AC | PRN
  Administered 2019-10-17: 100 mL via INTRAVENOUS

## 2019-10-17 NOTE — ED Triage Notes (Signed)
Pt comes POV from [place] with RUQ pain for two days. Pt states that she has been nauseous and had an episode of diarrhea. Pt states sometimes the area feels warm. Pt worried that she has appendicitis.

## 2019-10-17 NOTE — ED Provider Notes (Signed)
[facility] Emergency Department Provider Note   ____________________________________________    I have reviewed the triage vital signs and the nursing notes.   HISTORY  Chief Complaint Abdominal Pain     HPI Becky Hatfield is a 54 y.o. female with a history as noted below who presents with complaints of right-sided abdominal pain.  Patient reports several days of right-sided abdominal pain which radiates into her right lower quadrant, she is concerned she may have appendicitis.  She did go to urgent care and was sent here for evaluation for possible appendicitis.  She reports some nausea no vomiting.  Has had some diarrhea as well.  No history of abdominal surgery besides tubal ligation.  No fevers reported.  Does not take anything for this.  Past Medical History:  Diagnosis Date  . Kidney stones   . Renal disorder     Patient Active Problem List   Diagnosis Date Noted  . Recurrent UTI 07/02/2017    Past Surgical History:  Procedure Laterality Date  . BREAST BIOPSY Right 10/21/2016   path pending  . TUBAL LIGATION      Prior to Admission medications   Medication Sig Start Date End Date Taking? Authorizing Provider  clobetasol ointment (TEMOVATE) 0.05 % Apply to affected area every night for 4 weeks, then every other day for 4 weeks and then twice a week for 4 weeks or until resolution 11/02/18   Constant, Peggy, MD  estradiol (ESTRACE) 0.1 MG/GM vaginal cream Place 1 g vaginally 3 (three) times a week. Patient not taking: Reported on 08/12/2017 07/04/17   Hollice Espy, MD     Allergies Patient has no known allergies.  Family History  Problem Relation Age of Onset  . Hypertension Father   . Hypertension Mother   . Breast cancer Neg Hx     Social History Social History   Tobacco Use  . Smoking status: Never Smoker  . Smokeless tobacco: Never Used  Substance Use Topics  . Alcohol use: No    Alcohol/week: 0.0 standard drinks  .  Drug use: No    Review of Systems  Constitutional: No fever/chills Eyes: No visual changes.  ENT: No sore throat. Cardiovascular: Denies chest pain. Respiratory: Denies shortness of breath. Gastrointestinal: As above Genitourinary: Negative for dysuria. Musculoskeletal: Negative for back pain. Skin: Negative for rash. Neurological: Negative for headaches or weakness   ____________________________________________   PHYSICAL EXAM:  VITAL SIGNS: ED Triage Vitals [10/17/19 1317]  Enc Vitals Group     BP 133/87     Pulse Rate (!) 105     Resp 18     Temp 98.9 F (37.2 C)     Temp Source Oral     SpO2 98 %     Weight 72.6 kg (160 lb)     Height 1.626 m (5\' 4" )     Head Circumference      Peak Flow      Pain Score 6     Pain Loc      Pain Edu?      Excl. in [place]?     Constitutional: Alert and oriented.   Nose: No congestion/rhinnorhea. Mouth/Throat: Mucous membranes are moist.    Cardiovascular: Normal rate, regular rhythm.  Good peripheral circulation. Respiratory: Normal respiratory effort.  No retractions.  Gastrointestinal: Right-sided abdominal pain, right lower quadrant tenderness, no significant right upper quadrant tenderness, no distention.  No CVA tenderness.  Musculoskeletal: Warm and well perfused Neurologic:  Normal  speech and language. No gross focal neurologic deficits are appreciated.  Skin:  Skin is warm, dry and intact. No rash noted. Psychiatric: Mood and affect are normal. Speech and behavior are normal.  ____________________________________________   LABS (all labs ordered are listed, but only abnormal results are displayed)  Labs Reviewed  URINALYSIS, COMPLETE (UACMP) WITH MICROSCOPIC - Abnormal; Notable for the following components:      Result Value   Color, Urine YELLOW (*)    APPearance CLEAR (*)    Leukocytes,Ua TRACE (*)    All other components within normal limits  LIPASE, BLOOD  COMPREHENSIVE METABOLIC PANEL  CBC  POC URINE  PREG, ED   ____________________________________________  EKG  ED ECG REPORT I, Lavonia Drafts, the attending physician, personally viewed and interpreted this ECG.  Date: 10/17/2019  Rhythm: normal sinus rhythm QRS Axis: normal Intervals: normal ST/T Wave abnormalities: normal Narrative Interpretation: no evidence of acute ischemia  ____________________________________________  RADIOLOGY  CT abdomen pelvis demonstrates no appendicitis, questionable inflammation of the gallbladder Ultrasound right upper quadrant ____________________________________________   PROCEDURES  Procedure(s) performed: No  Procedures   Critical Care performed: No ____________________________________________   INITIAL IMPRESSION / ASSESSMENT AND PLAN / ED COURSE  Pertinent labs & imaging results that were available during my care of the patient were reviewed by me and considered in my medical decision making (see chart for details).  Patient presents with right-sided abdominal pain, she describes it radiating into her right lower quadrant.  Will treat with IV morphine, mild prolonged QTC on EKG, will forego Zofran at this time.  Differential includes appendicitis, cholecystitis, ureterolithiasis, colitis.  Patient feels significantly better after IV morphine.  CT scan suspicious for possible gallbladder inflammation, will send for ultrasound  Ultrasound negative for cholecystitis.  Given negative CT, negative ultrasound and her description of frequent indigestion possibility of gastritis, PUD, duodenal ulcer.  Will start on Protonix, Carafate and have her follow-up closely with GI.  If any worsening return to the emergency department    ____________________________________________   FINAL CLINICAL IMPRESSION(S) / ED DIAGNOSES  Final diagnoses:  Upper abdominal pain        Note:  This document was prepared using Dragon voice recognition software and may include unintentional  dictation errors.   Lavonia Drafts, MD 10/17/19 [phone]

## 2019-10-17 NOTE — ED Triage Notes (Signed)
FIRST NURSE NOTE:  Pt arrived from [facility] with reports of RUQ abd x 2 days with n/v.  No distress noted on arrival.

## 2019-10-20 ENCOUNTER — Ambulatory Visit (INDEPENDENT_AMBULATORY_CARE_PROVIDER_SITE_OTHER): Payer: BC Managed Care – PPO | Admitting: Gastroenterology

## 2019-10-20 ENCOUNTER — Encounter: Payer: Self-pay | Admitting: Gastroenterology

## 2019-10-20 DIAGNOSIS — R109 Unspecified abdominal pain: Secondary | ICD-10-CM | POA: Diagnosis not present

## 2019-10-20 DIAGNOSIS — K828 Other specified diseases of gallbladder: Secondary | ICD-10-CM | POA: Diagnosis not present

## 2019-10-20 DIAGNOSIS — Q433 Congenital malformations of intestinal fixation: Secondary | ICD-10-CM

## 2019-10-20 MED ORDER — PANTOPRAZOLE SODIUM 40 MG PO TBEC: 40.0000 mg | DELAYED_RELEASE_TABLET | Freq: Every day | ORAL | 0 refills | Status: DC

## 2019-10-20 MED ORDER — POLYETHYLENE GLYCOL 3350 17 G PO PACK: 17.0000 g | PACK | Freq: Every day | ORAL | 0 refills | Status: DC

## 2019-10-20 NOTE — Progress Notes (Signed)
re

## 2019-10-21 NOTE — Progress Notes (Signed)
Becky Hatfield [address]  [place]  [place], [place] [address]  Main: [phone]  Fax: [phone]   Gastroenterology Consultation  Referring Provider:     Maryland Pink, MD Primary Care Physician:  Maryland Pink, MD Reason for Consultation:     Abdominal pain        HPI:   Virtual Visit via Video Note  I connected with patient on 10/21/19 at  2:15 PM EDT by video ([email or web address]) and verified that I am speaking with the correct person using two identifiers.   I discussed the limitations, risks, security and privacy concerns of performing an evaluation and management service by video and the availability of in person appointments. I also discussed with the patient that there may be a patient responsible charge related to this service. The patient expressed understanding and agreed to proceed.  Location of the patient: Home Location of provider: Home Participating persons: Patient and provider only (Nursing staff checked in patient via phone but were not physically involved in the video interaction - see their notes)   History of Present Illness: Chief Complaint  Patient presents with  . New Patient (Initial Visit)  . Follow-up    ER follow up   . Abdominal Pain    Patient is having RLQ pain. Patient is swelling on the area. Patient had diarrhea on saturday and sunday. Has had some nausea   . Constipation    Patient is having some constipation has not went since sunday     Becky Hatfield is a 54 y.o. y/o female referred for consultation & management  by Dr. Maryland Pink, MD.  Patient recently went to the ER with abdominal pain that started in the periumbilical region radiating to the right mid quadrant.  No previous history of similar symptoms.  CT scan was negative for appendicitis.  It is reported questionable gallbladder wall thickening about the fundus.  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  Right upper quadrant ultrasound with normal gallbladder and the question of gallbladder wall thickening was not confirmed.  No biliary duct dilation.  No fever or chills when symptoms started.  Patient continues to have abdominal pain in the same location.  It is dull, with no fever or chills.  Has had some constipation.  No blood in stool.  No prior EGD or colonoscopy.  No family history of colon cancer  Past Medical History:  Diagnosis Date  . Kidney stones   . Renal disorder     Past Surgical History:  Procedure Laterality Date  . BREAST BIOPSY Right 10/21/2016   path pending  . TUBAL LIGATION      Prior to Admission medications   Medication Sig Start Date End Date Taking? Authorizing Provider  clobetasol ointment (TEMOVATE) 0.05 % Apply to affected area every night for 4 weeks, then every other day for 4 weeks and then twice a week for 4 weeks or until resolution 11/02/18  Yes Constant, Peggy, MD  sucralfate (CARAFATE) 1 g tablet Take 1 tablet (1 g total) by mouth 4 (four) times daily for 20 days. 10/17/19 11/06/19 Yes Lavonia Drafts, MD  pantoprazole (PROTONIX) 40 MG tablet Take 1 tablet (40 mg total) by mouth daily. 10/20/19   Virgel Manifold, MD  polyethylene glycol (MIRALAX MIX-IN PAX) 17 g packet Take 17 g by mouth daily. 10/20/19   Virgel Manifold, MD    Family History  Problem Relation Age  of Onset  . Hypertension Father   . Hypertension Mother   . Breast cancer Neg Hx      Social History   Tobacco Use  . Smoking status: Never Smoker  . Smokeless tobacco: Never Used  Substance Use Topics  . Alcohol use: No    Alcohol/week: 0.0 standard drinks  . Drug use: No    Allergies as of 10/20/2019  . (No Known Allergies)    Review of Systems:    All systems reviewed and negative except where noted in HPI.   Observations/Objective:  Labs: CBC    Component Value Date/Time   WBC 6.0 10/17/2019 1319   RBC 4.61 10/17/2019 1319   HGB 13.7 10/17/2019  1319   HGB 14.9 09/08/2017 1105   HCT 42.1 10/17/2019 1319   HCT 44.6 09/08/2017 1105   PLT 257 10/17/2019 1319   PLT 241 09/08/2017 1105   MCV 91.3 10/17/2019 1319   MCV 91 09/08/2017 1105   MCV 90 08/29/2013 0619   MCH 29.7 10/17/2019 1319   MCHC 32.5 10/17/2019 1319   RDW 12.6 10/17/2019 1319   RDW 13.4 09/08/2017 1105   RDW 13.6 08/29/2013 0619   LYMPHSABS 2.1 08/29/2013 0619   MONOABS 0.5 08/29/2013 0619   EOSABS 0.1 08/29/2013 0619   BASOSABS 0.1 08/29/2013 0619   CMP     Component Value Date/Time   NA 138 10/17/2019 1319   NA 139 09/08/2017 1105   NA 139 08/29/2013 0619   K 3.9 10/17/2019 1319   K 3.9 08/29/2013 0619   CL 106 10/17/2019 1319   CL 110 (H) 08/29/2013 0619   CO2 26 10/17/2019 1319   CO2 24 08/29/2013 0619   GLUCOSE 93 10/17/2019 1319   GLUCOSE 86 08/29/2013 0619   BUN 17 10/17/2019 1319   BUN 14 09/08/2017 1105   BUN 18 08/29/2013 0619   CREATININE 0.88 10/17/2019 1319   CREATININE 0.73 07/31/2016 0850   CALCIUM 9.0 10/17/2019 1319   CALCIUM 8.5 08/29/2013 0619   PROT 7.1 10/17/2019 1319   PROT 7.3 09/08/2017 1105   PROT 7.3 08/28/2013 1924   ALBUMIN 4.0 10/17/2019 1319   ALBUMIN 4.6 09/08/2017 1105   ALBUMIN 3.9 08/28/2013 1924   AST 19 10/17/2019 1319   AST 38 (H) 08/28/2013 1924   ALT 19 10/17/2019 1319   ALT 27 08/28/2013 1924   ALKPHOS 47 10/17/2019 1319   ALKPHOS 45 08/28/2013 1924   BILITOT 0.5 10/17/2019 1319   BILITOT 0.5 09/08/2017 1105   BILITOT 0.4 08/28/2013 1924   GFRNONAA >60 10/17/2019 1319   GFRNONAA >60 08/29/2013 0619   GFRAA >60 10/17/2019 1319   GFRAA >60 08/29/2013 0619    Imaging Studies: CT ABDOMEN PELVIS W CONTRAST  Result Date: 10/17/2019 CLINICAL DATA:  Right lower quadrant abdominal pain. Appendicitis suspected. Patient reports right upper quadrant abdominal pain for 2 days. Nausea and vomiting. EXAM: CT ABDOMEN AND PELVIS WITH CONTRAST TECHNIQUE: Multidetector CT imaging of the abdomen and pelvis was  performed using the standard protocol following bolus administration of intravenous contrast. CONTRAST:  161mL OMNIPAQUE IOHEXOL 300 MG/ML  SOLN COMPARISON:  Noncontrast CT 08/14/2012 FINDINGS: Lower chest: Minor hypoventilatory changes at the lung bases. No consolidation or pleural fluid. Hepatobiliary: Questionable gallbladder wall thickening about the fundus, series 2, image 27. No calcified gallstone or biliary dilatation. Small subcentimeter hypodensities in the right left lobe of the liver too small to characterize but likely small cysts. Pancreas: No ductal dilatation or inflammation. Spleen: Normal  in size without focal abnormality. Splenule at the hilum. Adrenals/Urinary Tract: Normal adrenal glands. Mild prominence of the right renal calices and ureter without frank hydronephrosis. There is no perinephric edema. Homogeneous renal enhancement with symmetric excretion on delayed phase imaging. Nonobstructing 5 mm stone in the lower left kidney. No left hydronephrosis or hydroureter. Urinary bladder is physiologically distended. No bladder wall thickening or stone. Stomach/Bowel: Stomach is partially distended and unremarkable. Duodenum does not cross the midline and jejunal bowel loops are in the right upper quadrant. There is mild swirling of the right upper quadrant mesentery, for example series 5, image 23. No small bowel inflammation or abnormal distention, particularly in the right upper quadrant jejunal small bowel loops. Cecum is normally positioned in the right lower quadrant. The remainder of the colon is also normally positioned, transverse colon is slightly redundant. Portions of normal appendix are seen in the right lower quadrant, for example series 2, image 64. There is no evidence of appendicitis. Vascular/Lymphatic: Mild aortic atherosclerosis. No aortic aneurysm. Portal vein is patent. Mesenteric vessels are patent. There is dilatation of the left ovarian vein without reflux of contrast.  Prominent right lower quadrant lymph nodes measure up to 7 mm short axis. These are also seen on prior exam. Few prominent central mesenteric nodes are likely reactive. Reproductive: Uterus is unremarkable. No adnexal mass. There is dilatation of the left ovarian vein but no increased periuterine or adnexal vascularity. Other: Small fat containing umbilical hernia. No free air or free fluid. Musculoskeletal: There are no acute or suspicious osseous abnormalities. IMPRESSION: 1. No evidence of appendicitis. 2. Questionable gallbladder wall thickening about the fundus. Recommend right upper quadrant ultrasound for further evaluation. 3. Nonobstructing left renal stone. 4. Partial small bowel malrotation which is chronic and was present on prior exam, with duodenum not crossing the midline and jejunal small bowel in the right upper quadrant. This is likely congenital. There is no evidence of obstruction or bowel inflammation. Cecum and colon are normally positioned. Aortic Atherosclerosis (ICD10-I70.0). Electronically Signed   By: Keith Rake M.D.   On: 10/17/2019 17:15   US Abdomen Limited RUQ  Result Date: 10/17/2019 CLINICAL DATA:  Upper abdominal pain for 3 days. EXAM: ULTRASOUND ABDOMEN LIMITED RIGHT UPPER QUADRANT COMPARISON:  CT earlier this day. FINDINGS: Gallbladder: Physiologically distended. No gallstones or wall thickening visualized. There is normal gallbladder wall thickness of 2-3 mm. No sonographic Murphy sign noted by sonographer. Common bile duct: Diameter: 3 mm, normal. Liver: Small subcentimeter low-density lesions on CT are not well visualized sonographically. Within normal limits in parenchymal echogenicity. Portal vein is patent on color Doppler imaging with normal direction of blood flow towards the liver. Other: None. IMPRESSION: Normal sonographic appearance of the gallbladder. The questioned gallbladder wall thickening on CT is not confirmed. No biliary dilatation. Electronically  Signed   By: Keith Rake M.D.   On: 10/17/2019 18:06    Assessment and Plan:   AMAZING COOLBAUGH is a 54 y.o. y/o female has been referred for abdominal pain  Assessment and Plan: Due to CT reporting gallbladder wall thickening, albeit ultrasound did not confirm it, and chronic malrotation, will refer to surgery for their opinion given that patient has ongoing pain.  We will treat with PPI if symptoms pain is in the middle of the upper and lower quadrants and could be due to dyspepsia  Since she also has some constipation, MiraLAX daily to see if that helps with her pain as well  However, if pain  acutely worsens patient advised to go to the ER or call us immediately to discuss a stat CT scan.  However, if she is unable to reach [place] or if it is after hours I have advised her to go to the ER at that time.  Follow Up Instructions:   I discussed the assessment and treatment plan with the patient. The patient was provided an opportunity to ask questions and all were answered. The patient agreed with the plan and demonstrated an understanding of the instructions.   The patient was advised to call back or seek an in-person evaluation if the symptoms worsen or if the condition fails to improve as anticipated.  I provided 15 minutes of face-to-face time via video software during this encounter.  Additional time was spent in reviewing patient's chart, placing orders etc.   Virgel Manifold, MD  Speech recognition software was used to dictate the above note.

## 2019-10-25 ENCOUNTER — Telehealth: Payer: Self-pay | Admitting: Gastroenterology

## 2019-10-25 NOTE — Telephone Encounter (Signed)
Patient states the abdominal pain has gotten worse in the upper right side abdominal pain. Patient states she stop the pantoprazole and carafate and stopped the medication on Sunday night. Patient states this morning she was in minimal pain even after breakfast. She state since she has eaten lunch the pain has gotten worse. Please advised

## 2019-10-25 NOTE — Telephone Encounter (Signed)
Pt left vm she is a pt of Dr. Bonna Gains and she  Meet with her virtually last week and she told her to let us know if the pain got worse she is not scheduled with a surgeon until 11/03/19 and she thinks it is her gallbladder please call pt

## 2019-10-26 ENCOUNTER — Other Ambulatory Visit: Payer: Self-pay | Admitting: Gastroenterology

## 2019-10-26 ENCOUNTER — Other Ambulatory Visit: Payer: Self-pay

## 2019-10-26 DIAGNOSIS — R1084 Generalized abdominal pain: Secondary | ICD-10-CM

## 2019-10-26 DIAGNOSIS — R1011 Right upper quadrant pain: Secondary | ICD-10-CM

## 2019-10-26 NOTE — Telephone Encounter (Signed)
Just receiving this message today 10/26/2019 as I was off until today and since this message was sent. Clinic staff is aware of my schedule. On last visit, Pt was advised to go to the ER with acute worsening of pain. Will call the pt today to discuss symptoms and next steps.

## 2019-10-26 NOTE — Telephone Encounter (Signed)
Got patient surgeon appointment moved to 11/01/2019 at 4:00pm

## 2019-10-26 NOTE — Telephone Encounter (Signed)
Order scan for 11/01/2019 [facility] 7[facility]. Informed patient and patient verbalized understanding

## 2019-10-26 NOTE — Telephone Encounter (Signed)
Called patient and patient is available to talk now. You can call her at [phone]

## 2019-10-27 LAB — COMPREHENSIVE METABOLIC PANEL
ALT: 13 IU/L (ref 0–32)
AST: 14 IU/L (ref 0–40)
Albumin/Globulin Ratio: 1.8 (ref 1.2–2.2)
Albumin: 4.6 g/dL (ref 3.8–4.9)
Alkaline Phosphatase: 58 IU/L (ref 39–117)
BUN/Creatinine Ratio: 17 (ref 9–23)
BUN: 14 mg/dL (ref 6–24)
Bilirubin Total: 0.4 mg/dL (ref 0.0–1.2)
CO2: 27 mmol/L (ref 20–29)
Calcium: 10.2 mg/dL (ref 8.7–10.2)
Chloride: 102 mmol/L (ref 96–106)
Creatinine, Ser: 0.83 mg/dL (ref 0.57–1.00)
GFR calc Af Amer: 92 mL/min/{1.73_m2} (ref >59)
GFR calc non Af Amer: 80 mL/min/{1.73_m2} (ref >59)
Globulin, Total: 2.6 g/dL (ref 1.5–4.5)
Glucose: 88 mg/dL (ref 65–99)
Potassium: 4.5 mmol/L (ref 3.5–5.2)
Sodium: 141 mmol/L (ref 134–144)
Total Protein: 7.2 g/dL (ref 6.0–8.5)

## 2019-10-27 LAB — CBC
Hematocrit: 42.9 % (ref 34.0–46.6)
Hemoglobin: 14.4 g/dL (ref 11.1–15.9)
MCH: 29.7 pg (ref 26.6–33.0)
MCHC: 33.6 g/dL (ref 31.5–35.7)
MCV: 89 fL (ref 79–97)
Platelets: 282 10*3/uL (ref 150–450)
RBC: 4.85 x10E6/uL (ref 3.77–5.28)
RDW: 12.1 % (ref 11.7–15.4)
WBC: 6.2 10*3/uL (ref 3.4–10.8)

## 2019-10-27 LAB — SEDIMENTATION RATE: Sed Rate: 8 mm/hr (ref 0–40)

## 2019-10-27 LAB — C-REACTIVE PROTEIN: CRP: 6 mg/L (ref 0–10)

## 2019-11-01 ENCOUNTER — Ambulatory Visit: Payer: BC Managed Care – PPO | Admitting: Surgery

## 2019-11-01 ENCOUNTER — Ambulatory Visit
Admission: RE | Admit: 2019-11-01 | Discharge: 2019-11-01 | Disposition: A | Discharge status: Home / Self Care | Payer: BC Managed Care – PPO | Source: Ambulatory Visit | Attending: Gastroenterology | Admitting: Gastroenterology

## 2019-11-01 ENCOUNTER — Other Ambulatory Visit: Payer: Self-pay

## 2019-11-01 ENCOUNTER — Encounter: Payer: Self-pay | Admitting: Surgery

## 2019-11-01 VITALS — BP 135/83 | HR 98 | Temp 97.2°F | Resp 12 | Ht 64.0 in | Wt 160.2 lb

## 2019-11-01 DIAGNOSIS — G588 Other specified mononeuropathies: Secondary | ICD-10-CM | POA: Diagnosis not present

## 2019-11-01 DIAGNOSIS — R1011 Right upper quadrant pain: Secondary | ICD-10-CM | POA: Diagnosis present

## 2019-11-01 MED ORDER — TECHNETIUM TC 99M MEBROFENIN IV KIT
4.9420 | PACK | Freq: Once | INTRAVENOUS | Status: AC | PRN
  Administered 2019-11-01: 07:00:00 4.942 via INTRAVENOUS

## 2019-11-01 MED ORDER — GABAPENTIN 300 MG PO CAPS: 300.0000 mg | ORAL_CAPSULE | Freq: Every day | ORAL | 0 refills | Status: DC

## 2019-11-01 MED ORDER — GABAPENTIN 100 MG PO CAPS: 100.0000 mg | ORAL_CAPSULE | Freq: Two times a day (BID) | ORAL | 0 refills | Status: DC

## 2019-11-01 NOTE — Patient Instructions (Addendum)
Pick up your prescription at your local pharmacy. Use ice packs to help relieve the pain. Keep your appointment with Dr Bonna Gains for 11/18/19.   We will see you for a follow up. Please see your appointment below.

## 2019-11-03 ENCOUNTER — Ambulatory Visit: Payer: Self-pay | Admitting: Surgery

## 2019-11-04 NOTE — Progress Notes (Signed)
Patient ID: Becky Hatfield, female   DOB: April 20, 1966, 54 y.o.   MRN: [id number]  HPI Becky Hatfield is a 54 y.o. female in consultation at the request of Dr. Bonna Gains for right upper quadrant pain she reports having the right upper quadrant pain for the last couple of weeks or so.  The pain is sharp intermittent worsening with certain movements.  The pain is moderate intensity.  Apparently gets worse with certain foods.  CT scan as well as ultrasound personally reviewed.  The CT scan showed mild gallbladder thickening.  Dr. Ula Lingo any order a HIDA scan that I have also reviewed there is no evidence of cholecystitis and there is normal ejection fraction.  She did however that pain after she consumed the Ensure. She does have decreased appetite and nausea.  Her CBC and CMP were completely normal.  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   HPI  Past Medical History:  Diagnosis Date  . Kidney stones   . Renal disorder     Past Surgical History:  Procedure Laterality Date  . BREAST BIOPSY Right 10/21/2016   path pending  . TUBAL LIGATION      Family History  Problem Relation Age of Onset  . Hypertension Father   . Anuerysm Father   . Hypertension Mother   . Stroke Mother   . Breast cancer Neg Hx     Social History Social History   Tobacco Use  . Smoking status: Never Smoker  . Smokeless tobacco: Never Used  Substance Use Topics  . Alcohol use: No    Alcohol/week: 0.0 standard drinks  . Drug use: No    No Known Allergies  Current Outpatient Medications  Medication Sig Dispense Refill  . clobetasol ointment (TEMOVATE) 0.05 % Apply to affected area every night for 4 weeks, then every other day for 4 weeks and then twice a week for 4 weeks or until resolution 30 g 2  . gabapentin (NEURONTIN) 100 MG capsule Take 1 capsule (100 mg total) by mouth 2 (two) times daily. 30 capsule 0  .  gabapentin (NEURONTIN) 300 MG capsule Take 1 capsule (300 mg total) by mouth at bedtime. 30 capsule 0   No current facility-administered medications for this visit.     Review of Systems Full ROS  was asked and was negative except for the information on the HPI  Physical Exam Blood pressure 135/83, pulse 98, temperature (!) 97.2 F (36.2 C), resp. rate 12, height 5\' 4"  (1.626 m), weight 160 lb 3.2 oz (72.7 kg), last menstrual period 10/02/2015, SpO2 95 %. CONSTITUTIONAL: NAD EYES: Pupils are equal, round, and reactive to light, Sclera are non-icteric. EARS, NOSE, MOUTH AND THROAT: The oropharynx is clear. The oral mucosa is pink and moist. Hearing is intact to voice. LYMPH NODES:  Lymph nodes in the neck are normal. RESPIRATORY:  Lungs are clear. There is normal respiratory effort, with equal breath sounds bilaterally, and without pathologic use of accessory muscles.  There is exquisite tenderness to palpation in the right chest wall. CARDIOVASCULAR: Heart is regular without murmurs, gallops, or rubs. GI: The abdomen is soft, mild tenderness to palpation right upper quadrant . There are no palpable masses. There is no hepatosplenomegaly. There are normal bowel sounds in all quadrants. GU: Rectal deferred.   MUSCULOSKELETAL: Normal muscle strength and tone. No cyanosis or edema.   SKIN: Turgor is good  and there are no pathologic skin lesions or ulcers. NEUROLOGIC: Motor and sensation is grossly normal. Cranial nerves are grossly intact. PSYCH:  Oriented to person, place and time. Affect is normal.  Data Reviewed  I have personally reviewed the patient's imaging, laboratory findings and medical records.    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  Although I cannot definitively exclude gallbladder as the culprit symptoms are not classical for such.  May benefit from endoscopic evaluation by GI. For  now I will hold off for any cholecystectomy at this time.  Eventually if we find that there is no other explanation for the patient's symptoms we can go ahead and perform a cholecystectomy but I do think that we need to start with an endoscopic evaluation first. I have also discussed with her about gabapentin ice and potentially intercostal nerve block.  She wishes to start those medications.  We will try to stay away with NSAIDs since this will confound potentially any GI symptoms. A copy of this report was sent to the referring provider and I discussed the case in detail with Dr. Bonna Gains Time spent with the patient was 60 minutes, with more than 50% of the time spent in face-to-face education, counseling and care coordination.     Caroleen Hamman, MD FACS General Surgeon 11/04/2019, 1:38 PM

## 2019-11-08 ENCOUNTER — Telehealth: Payer: Self-pay

## 2019-11-08 NOTE — Telephone Encounter (Signed)
-----   Message from Virgel Manifold, MD sent at 11/04/2019 11:18 AM EDT ----- Ginger please let the patient know, I discussed things with Dr. Dahlia Byes and since he does not think her pain is from her gallbladder and recommends endoscopy we can go ahead and schedule that. Please schedule EGD for abdominal pan and colonoscopy for screening with me

## 2019-11-08 NOTE — Telephone Encounter (Signed)
Patient states she is not hurting at this time and does not want to scheduled the procedure right now. She states she has a virtual appointment with Dr. Fontaine No on 11/18/2019 and will discuss a plan with her then.

## 2019-11-11 ENCOUNTER — Other Ambulatory Visit: Payer: Self-pay | Admitting: Gastroenterology

## 2019-11-18 ENCOUNTER — Ambulatory Visit (INDEPENDENT_AMBULATORY_CARE_PROVIDER_SITE_OTHER): Payer: BC Managed Care – PPO | Admitting: Gastroenterology

## 2019-11-18 ENCOUNTER — Other Ambulatory Visit: Payer: Self-pay

## 2019-11-18 ENCOUNTER — Telehealth: Payer: Self-pay

## 2019-11-18 ENCOUNTER — Encounter: Payer: Self-pay | Admitting: Gastroenterology

## 2019-11-18 DIAGNOSIS — R109 Unspecified abdominal pain: Secondary | ICD-10-CM | POA: Diagnosis not present

## 2019-11-18 DIAGNOSIS — Z1211 Encounter for screening for malignant neoplasm of colon: Secondary | ICD-10-CM

## 2019-11-18 MED ORDER — NA SULFATE-K SULFATE-MG SULF 17.5-3.13-1.6 GM/177ML PO SOLN: 354.0000 mL | Freq: Once | ORAL | 0 refills | Status: AC

## 2019-11-18 NOTE — Telephone Encounter (Signed)
Patient needs colonoscopy scheduled for screening. Called and left a message for call back to scheduled this

## 2019-11-18 NOTE — Progress Notes (Signed)
Vonda Antigua, MD [address]  [place]  [place], [place] [address]  Main: [phone]  Fax: [phone]   Primary Care Physician: Maryland Pink, MD  Virtual Visit via Video Note  I connected with patient on 11/18/19 at  2:45 PM EDT by video (using [email or web address]) and verified that I am speaking with the correct person using two identifiers.   I discussed the limitations, risks, security and privacy concerns of performing an evaluation and management service by video and the availability of in person appointments. I also discussed with the patient that there may be a patient responsible charge related to this service. The patient expressed understanding and agreed to proceed.  Location of Patient: Home Location of Provider: Home Persons involved: Patient and provider only (Nursing staff checked in patient via phone but were not physically involved in the video interaction - see their notes)   History of Present Illness: Chief Complaint  Patient presents with  . Abdominal Pain    patient states she is having no pain now     HPI: Becky Hatfield is a 54 y.o. female previously seen for abdominal pain here for follow-up.  Patient has previously been to the ER with imaging reporting gallbladder wall thickening and chronic malrotation.  She was referred to surgery and they diagnosed her with intercostal neuralgia. Pt was given gabapentin and she states symptoms are now resolved.   The patient denies abdominal or flank pain, anorexia, nausea or vomiting, dysphagia, change in bowel habits or black or bloody stools or weight loss.   Current Outpatient Medications  Medication Sig Dispense Refill  . clobetasol ointment (TEMOVATE) 0.05 % Apply to affected area every night for 4 weeks, then every other day for 4 weeks and then twice a week for 4 weeks or until resolution 30 g 2  . gabapentin (NEURONTIN) 100 MG capsule Take 1 capsule (100 mg total) by mouth 2 (two) times daily. 30  capsule 0  . gabapentin (NEURONTIN) 300 MG capsule Take 1 capsule (300 mg total) by mouth at bedtime. 30 capsule 0   No current facility-administered medications for this visit.    Allergies as of 11/18/2019  . (No Known Allergies)    Review of Systems:    All systems reviewed and negative except where noted in HPI.   Observations/Objective:  Labs: CMP     Component Value Date/Time   NA 141 10/26/2019 1553   NA 139 08/29/2013 0619   K 4.5 10/26/2019 1553   K 3.9 08/29/2013 0619   CL 102 10/26/2019 1553   CL 110 (H) 08/29/2013 0619   CO2 27 10/26/2019 1553   CO2 24 08/29/2013 0619   GLUCOSE 88 10/26/2019 1553   GLUCOSE 93 10/17/2019 1319   GLUCOSE 86 08/29/2013 0619   BUN 14 10/26/2019 1553   BUN 18 08/29/2013 0619   CREATININE 0.83 10/26/2019 1553   CREATININE 0.73 07/31/2016 0850   CALCIUM 10.2 10/26/2019 1553   CALCIUM 8.5 08/29/2013 0619   PROT 7.2 10/26/2019 1553   PROT 7.3 08/28/2013 1924   ALBUMIN 4.6 10/26/2019 1553   ALBUMIN 3.9 08/28/2013 1924   AST 14 10/26/2019 1553   AST 38 (H) 08/28/2013 1924   ALT 13 10/26/2019 1553   ALT 27 08/28/2013 1924   ALKPHOS 58 10/26/2019 1553   ALKPHOS 45 08/28/2013 1924   BILITOT 0.4 10/26/2019 1553   BILITOT 0.4 08/28/2013 1924   GFRNONAA 80 10/26/2019 1553   GFRNONAA >60 08/29/2013 [id number]  GFRAA 92 10/26/2019 1553   GFRAA >60 08/29/2013 0619   Lab Results  Component Value Date   WBC 6.2 10/26/2019   HGB 14.4 10/26/2019   HCT 42.9 10/26/2019   MCV 89 10/26/2019   PLT 282 10/26/2019    Imaging Studies: NM Hepato W/EjeCT Fract  Result Date: 11/01/2019 CLINICAL DATA:  Right upper quadrant pain EXAM: NUCLEAR MEDICINE HEPATOBILIARY IMAGING WITH GALLBLADDER EF VIEWS: Anterior right upper quadrant RADIOPHARMACEUTICALS:  4.94 mCi Tc-65m mebrofenin IV COMPARISON:  None. FINDINGS: Liver uptake of radiotracer is unremarkable. There is prompt visualization of gallbladder and small bowel, indicating patency of the cystic  and common bile ducts. The patient consumed 8 ounces of Ensure orally with calculation of the computer generated ejection fraction of radiotracer the gallbladder. The patient experienced abdominal pain with the oral Ensure consumption. The computer generated ejection fraction of radiotracer from the gallbladder is normal at 54%, normal greater than 33% using the oral agent. IMPRESSION: Normal ejection fraction of radiotracer from the gallbladder. The patient experience clinical symptoms with the oral Ensure consumption. Cystic and common bile ducts are patent as is evidenced by visualization of gallbladder and small bowel. Electronically Signed   By: Lowella Grip III M.D.   On: 11/01/2019 15:08    Assessment and Plan:   Becky Hatfield is a 54 y.o. y/o female here for follow up of abdominal pain  Assessment and Plan: Symptoms resolved with gabapentin  Pt is not interested in upper endoscopy at this time but will let us know if symptoms return  Willing to schedule screening colonoscopy in June or July  Follow Up Instructions:    I discussed the assessment and treatment plan with the patient. The patient was provided an opportunity to ask questions and all were answered. The patient agreed with the plan and demonstrated an understanding of the instructions.   The patient was advised to call back or seek an in-person evaluation if the symptoms worsen or if the condition fails to improve as anticipated.  I provided 15 minutes of face-to-face time via video software during this encounter. Additional time was spent in reviewing patient's chart, placing orders etc.   Virgel Manifold, MD  Speech recognition software was used to dictate this note.

## 2019-11-18 NOTE — Telephone Encounter (Signed)
Scheduled patient for 01/12/2020

## 2019-11-18 NOTE — Addendum Note (Signed)
Addended by: Ulyess Blossom L on: 11/18/2019 04:10 PM   Modules accepted: Orders

## 2019-12-01 ENCOUNTER — Ambulatory Visit: Payer: BC Managed Care – PPO | Admitting: Surgery

## 2020-01-05 ENCOUNTER — Telehealth: Payer: Self-pay

## 2020-01-05 ENCOUNTER — Other Ambulatory Visit: Payer: Self-pay

## 2020-01-05 MED ORDER — NA SULFATE-K SULFATE-MG SULF 17.5-3.13-1.6 GM/177ML PO SOLN: 354.0000 mL | Freq: Once | ORAL | 0 refills | Status: AC

## 2020-01-05 NOTE — Telephone Encounter (Signed)
LVM for patient to call office to reschedule her colonoscopy with Dr. Bonna Gains due to change in Dr. Michele Mcalpine schedule.  Thanks,  [place], [place]

## 2020-01-10 ENCOUNTER — Telehealth: Payer: Self-pay

## 2020-01-10 NOTE — Telephone Encounter (Signed)
Called [facility] and they stated that patient will be needing a new prescription for Clepiq since Suprep was not covered by her insurance.

## 2020-01-10 NOTE — Telephone Encounter (Signed)
Patient is calling because she just received a text from [facility] stating they did not have the Suprep or it was on back order. Please advised on the prep. Procedure is on Friday

## 2020-01-11 MED ORDER — CLENPIQ 10-3.5-12 MG-GM -GM/160ML PO SOLN: 1.0000 | Freq: Once | ORAL | 0 refills | Status: AC

## 2020-01-11 NOTE — Telephone Encounter (Signed)
Called patient and she stated that she is feeling congested and therefore she decided to switch her day to June 23 rd instead of June the 11 th. I called Trish and let her know. New letters will be sent to patient per her request.

## 2020-01-12 ENCOUNTER — Other Ambulatory Visit: Admission: RE | Admit: 2020-01-12 | Payer: BC Managed Care – PPO | Source: Ambulatory Visit

## 2020-01-27 ENCOUNTER — Ambulatory Visit
Admission: RE | Admit: 2020-01-27 | Payer: BC Managed Care – PPO | Source: Ambulatory Visit | Admitting: Gastroenterology

## 2020-01-27 ENCOUNTER — Encounter: Admission: RE | Payer: Self-pay | Source: Ambulatory Visit

## 2020-01-27 SURGERY — COLONOSCOPY WITH PROPOFOL
Anesthesia: General

## 2020-04-08 ENCOUNTER — Other Ambulatory Visit: Payer: Self-pay | Admitting: Obstetrics and Gynecology

## 2020-04-20 ENCOUNTER — Telehealth: Payer: Self-pay | Admitting: *Deleted

## 2020-04-20 MED ORDER — CLOBETASOL PROPIONATE 0.05 % EX OINT: TOPICAL_OINTMENT | CUTANEOUS | 2 refills | Status: DC

## 2020-04-20 NOTE — Telephone Encounter (Signed)
Pt left VM message requesting refill of Clobetasol cream. She stated that she saw Dr. Elly Modena one year ago. Refill approved by Dr. Elly Modena and was e-prescribed. Pt was called and informed of refill sent. She was also advised of need to schedule annual Gyn exam. Pt agreed and voiced understanding.

## 2020-08-21 ENCOUNTER — Ambulatory Visit: Payer: BC Managed Care – PPO | Admitting: Obstetrics and Gynecology

## 2020-09-18 ENCOUNTER — Other Ambulatory Visit: Payer: Self-pay

## 2020-09-18 ENCOUNTER — Ambulatory Visit (INDEPENDENT_AMBULATORY_CARE_PROVIDER_SITE_OTHER): Payer: BC Managed Care – PPO | Admitting: Obstetrics and Gynecology

## 2020-09-18 ENCOUNTER — Other Ambulatory Visit (HOSPITAL_COMMUNITY)
Admission: RE | Admit: 2020-09-18 | Discharge: 2020-09-18 | Disposition: A | Discharge status: Home / Self Care | Payer: BC Managed Care – PPO | Source: Ambulatory Visit | Attending: Obstetrics and Gynecology | Admitting: Obstetrics and Gynecology

## 2020-09-18 ENCOUNTER — Encounter: Payer: Self-pay | Admitting: Obstetrics and Gynecology

## 2020-09-18 VITALS — BP 125/74 | HR 81 | Ht 64.0 in | Wt 136.0 lb

## 2020-09-18 DIAGNOSIS — Z01419 Encounter for gynecological examination (general) (routine) without abnormal findings: Secondary | ICD-10-CM

## 2020-09-18 MED ORDER — CLOBETASOL PROPIONATE 0.05 % EX OINT: TOPICAL_OINTMENT | CUTANEOUS | 4 refills | Status: DC

## 2020-09-18 NOTE — Progress Notes (Signed)
Subjective:     Becky Hatfield is a 55 y.o. female P3 postmenopausal with BMI 23 who is here for a comprehensive physical exam. The patient reports no problems. Patient is sexually active without complaints. She denies any episodes of postmenopausal vaginal bleeding. She denies urinary incontinence. She continues to use clobetasol cream as needed for lichen sclerosis. She has joined a gym and has lost 25lb. Patient is planning her son and one of her daughter wedding this summer  Past Medical History:  Diagnosis Date  . Kidney stones   . Renal disorder    Past Surgical History:  Procedure Laterality Date  . BREAST BIOPSY Right 10/21/2016   path pending  . TUBAL LIGATION     Family History  Problem Relation Age of Onset  . Hypertension Father   . Anuerysm Father   . Hypertension Mother   . Stroke Mother   . Breast cancer Neg Hx     Social History   Socioeconomic History  . Marital status: Married    Spouse name: Not on file  . Number of children: Not on file  . Years of education: Not on file  . Highest education level: Not on file  Occupational History  . Not on file  Tobacco Use  . Smoking status: Never Smoker  . Smokeless tobacco: Never Used  Vaping Use  . Vaping Use: Never used  Substance and Sexual Activity  . Alcohol use: No    Alcohol/week: 0.0 standard drinks  . Drug use: No  . Sexual activity: Yes    Partners: Male    Birth control/protection: Surgical  Other Topics Concern  . Not on file  Social History Narrative  . Not on file   Social Determinants of Health   Financial Resource Strain: Not on file  Food Insecurity: Not on file  Transportation Needs: Not on file  Physical Activity: Not on file  Stress: Not on file  Social Connections: Not on file  Intimate Partner Violence: Not on file   Health Maintenance  Topic Date Due  . Hepatitis C Screening  Never done  . COVID-19 Vaccine (1) Never done  . HIV Screening  Never done  . TETANUS/TDAP   Never done  . COLONOSCOPY (Pts 45-50yrs Insurance coverage will need to be confirmed)  Never done  . INFLUENZA VACCINE  Never done  . MAMMOGRAM  08/10/2021  . PAP SMEAR-Modifier  04/07/2022       Review of Systems Pertinent items noted in HPI and remainder of comprehensive ROS otherwise negative.   Objective:  Blood pressure 125/74, pulse 81, height 5\' 4"  (1.626 m), weight 136 lb (61.7 kg), last menstrual period 10/02/2015.     GENERAL: Well-developed, well-nourished female in no acute distress.  HEENT: Normocephalic, atraumatic. Sclerae anicteric.  NECK: Supple. Normal thyroid.  LUNGS: Clear to auscultation bilaterally.  HEART: Regular rate and rhythm. BREASTS: Symmetric in size. No palpable masses or lymphadenopathy, skin changes, or nipple drainage. ABDOMEN: Soft, nontender, nondistended. No organomegaly. PELVIC: Normal external female genitalia. Vagina is pink and rugated.  Small 0.5 cm on anterior vaginal mucosa present. Normal discharge. Normal appearing cervix. Uterus is normal in size. No adnexal mass or tenderness. EXTREMITIES: No cyanosis, clubbing, or edema, 2+ distal pulses.    Assessment:    Healthy female exam.      Plan:    Pap smear collected Screening mammogram ordered Health maintenance labs ordered Colonoscopy referral provided Refill on clobetasol cream provided Patient will be contacted with abnormal  results See After Visit Summary for Counseling Recommendations

## 2020-09-18 NOTE — Progress Notes (Signed)
Pt states she would like fasting lab work.  Pt is still having same vaginal issues, using Clobetasol cream.

## 2020-09-19 LAB — COMPREHENSIVE METABOLIC PANEL
ALT: 22 IU/L (ref 0–32)
AST: 21 IU/L (ref 0–40)
Albumin/Globulin Ratio: 2.3 — ABNORMAL HIGH (ref 1.2–2.2)
Albumin: 4.5 g/dL (ref 3.8–4.9)
Alkaline Phosphatase: 48 IU/L (ref 44–121)
BUN/Creatinine Ratio: 22 (ref 9–23)
BUN: 15 mg/dL (ref 6–24)
Bilirubin Total: 0.3 mg/dL (ref 0.0–1.2)
CO2: 20 mmol/L (ref 20–29)
Calcium: 9.3 mg/dL (ref 8.7–10.2)
Chloride: 105 mmol/L (ref 96–106)
Creatinine, Ser: 0.67 mg/dL (ref 0.57–1.00)
GFR calc Af Amer: 114 mL/min/{1.73_m2} (ref >59)
GFR calc non Af Amer: 99 mL/min/{1.73_m2} (ref >59)
Globulin, Total: 2 g/dL (ref 1.5–4.5)
Glucose: 95 mg/dL (ref 65–99)
Potassium: 4 mmol/L (ref 3.5–5.2)
Sodium: 144 mmol/L (ref 134–144)
Total Protein: 6.5 g/dL (ref 6.0–8.5)

## 2020-09-19 LAB — LIPID PANEL
Chol/HDL Ratio: 3.7 ratio (ref 0.0–4.4)
Cholesterol, Total: 246 mg/dL — ABNORMAL HIGH (ref 100–199)
HDL: 66 mg/dL (ref >39)
LDL Chol Calc (NIH): 173 mg/dL — ABNORMAL HIGH (ref 0–99)
Triglycerides: 45 mg/dL (ref 0–149)
VLDL Cholesterol Cal: 7 mg/dL (ref 5–40)

## 2020-09-19 LAB — CBC
Hematocrit: 41.9 % (ref 34.0–46.6)
Hemoglobin: 14 g/dL (ref 11.1–15.9)
MCH: 30.2 pg (ref 26.6–33.0)
MCHC: 33.4 g/dL (ref 31.5–35.7)
MCV: 91 fL (ref 79–97)
Platelets: 247 10*3/uL (ref 150–450)
RBC: 4.63 x10E6/uL (ref 3.77–5.28)
RDW: 12.4 % (ref 11.7–15.4)
WBC: 5 10*3/uL (ref 3.4–10.8)

## 2020-09-19 LAB — HEMOGLOBIN A1C
Est. average glucose Bld gHb Est-mCnc: 114 mg/dL
Hgb A1c MFr Bld: 5.6 % (ref 4.8–5.6)

## 2020-09-19 LAB — TSH: TSH: 1.27 u[IU]/mL (ref 0.450–4.500)

## 2020-09-20 LAB — CYTOLOGY - PAP
Comment: NEGATIVE
Diagnosis: NEGATIVE
High risk HPV: NEGATIVE

## 2020-10-03 ENCOUNTER — Ambulatory Visit
Admission: RE | Admit: 2020-10-03 | Discharge: 2020-10-03 | Disposition: A | Discharge status: Home / Self Care | Payer: BC Managed Care – PPO | Source: Ambulatory Visit | Attending: Obstetrics and Gynecology | Admitting: Obstetrics and Gynecology

## 2020-10-03 ENCOUNTER — Other Ambulatory Visit: Payer: Self-pay

## 2020-10-03 DIAGNOSIS — Z1231 Encounter for screening mammogram for malignant neoplasm of breast: Secondary | ICD-10-CM | POA: Insufficient documentation

## 2020-10-03 DIAGNOSIS — Z01419 Encounter for gynecological examination (general) (routine) without abnormal findings: Secondary | ICD-10-CM

## 2020-10-09 ENCOUNTER — Other Ambulatory Visit: Payer: Self-pay | Admitting: Obstetrics and Gynecology

## 2020-10-09 DIAGNOSIS — R928 Other abnormal and inconclusive findings on diagnostic imaging of breast: Secondary | ICD-10-CM

## 2020-10-09 DIAGNOSIS — N6489 Other specified disorders of breast: Secondary | ICD-10-CM

## 2020-10-31 ENCOUNTER — Other Ambulatory Visit: Payer: Self-pay

## 2020-10-31 ENCOUNTER — Ambulatory Visit: Admission: RE | Admit: 2020-10-31 | Payer: BC Managed Care – PPO | Source: Ambulatory Visit

## 2020-10-31 ENCOUNTER — Ambulatory Visit
Admission: RE | Admit: 2020-10-31 | Discharge: 2020-10-31 | Disposition: A | Discharge status: Home / Self Care | Payer: BC Managed Care – PPO | Source: Ambulatory Visit | Attending: Obstetrics and Gynecology | Admitting: Obstetrics and Gynecology

## 2020-10-31 ENCOUNTER — Other Ambulatory Visit: Payer: Self-pay | Admitting: Obstetrics and Gynecology

## 2020-10-31 DIAGNOSIS — R928 Other abnormal and inconclusive findings on diagnostic imaging of breast: Secondary | ICD-10-CM | POA: Insufficient documentation

## 2020-10-31 DIAGNOSIS — N6489 Other specified disorders of breast: Secondary | ICD-10-CM | POA: Insufficient documentation

## 2020-11-02 ENCOUNTER — Telehealth: Payer: Self-pay | Admitting: Gastroenterology

## 2020-11-02 NOTE — Telephone Encounter (Signed)
This patient's husband had a colonoscopy with me today. I met both of them during his hospitalization in November 2021. She asked that I perform her colonoscopy in June or July. After my discussion with her today, she is a good candidate for open access colonoscopy.

## 2020-11-02 NOTE — Telephone Encounter (Addendum)
Hey Dr Loletha Carrow, this pt is wishing to schedule her colonoscopy with you since you see her husband but it looks like she was last seen by Dr Bonna Gains [facility] on 11/2019, records are in epic for review, would you agree to take her as a new pt?

## 2020-12-28 ENCOUNTER — Encounter: Payer: Self-pay | Admitting: Gastroenterology

## 2021-02-19 ENCOUNTER — Ambulatory Visit (AMBULATORY_SURGERY_CENTER): Payer: BC Managed Care – PPO | Admitting: *Deleted

## 2021-02-19 ENCOUNTER — Other Ambulatory Visit: Payer: Self-pay

## 2021-02-19 VITALS — Ht 64.0 in | Wt 129.0 lb

## 2021-02-19 DIAGNOSIS — Z1211 Encounter for screening for malignant neoplasm of colon: Secondary | ICD-10-CM

## 2021-02-19 NOTE — Progress Notes (Signed)
No egg or soy allergy known to patient  No issues with past sedation with any surgeries or procedures Patient denies ever being told they had issues or difficulty with intubation  No FH of Malignant Hyperthermia No diet pills per patient No home 02 use per patient  No blood thinners per patient  Pt denies issues with constipation  No A fib or A flutter  EMMI video to pt or via [place] 19 guidelines implemented in [place] today with Pt and RN  Pt is fully vaccinated  for Covid   Due to the COVID-19 pandemic we are asking patients to follow certain guidelines.  Pt aware of COVID protocols and [facility] guidelines   Pt verified name, DOB, address and insurance during PV today. Pt mailed instruction packet to included paper to complete and mail back to [facility] with addressed and stamped envelope, Emmi video, copy of consent form to read and not return, and instructions.  Pt encouraged to call with questions or issues.  My Chart instructions to pt as well

## 2021-03-02 ENCOUNTER — Encounter: Payer: Self-pay | Admitting: Gastroenterology

## 2021-03-02 ENCOUNTER — Ambulatory Visit (AMBULATORY_SURGERY_CENTER): Payer: BC Managed Care – PPO | Admitting: Gastroenterology

## 2021-03-02 ENCOUNTER — Other Ambulatory Visit: Payer: Self-pay

## 2021-03-02 VITALS — BP 131/91 | HR 81 | Temp 98.2°F | Resp 19 | Ht 64.0 in | Wt 124.0 lb

## 2021-03-02 DIAGNOSIS — Z1211 Encounter for screening for malignant neoplasm of colon: Secondary | ICD-10-CM

## 2021-03-02 MED ORDER — SODIUM CHLORIDE 0.9 % IV SOLN: 500.0000 mL | Freq: Once | INTRAVENOUS | Status: DC

## 2021-03-02 NOTE — Progress Notes (Signed)
Late entry: 1333 Robinul 0.2 mg IV given due large amount of secretions upon assessment.  Patient experiencing nausea and vomiting.  MD updated and Zofran 4 mg IV given, vss

## 2021-03-02 NOTE — Progress Notes (Signed)
1355 Patient experiencing nausea and vomiting.  MD updated and Zofran 4 mg IV given, vss

## 2021-03-02 NOTE — Progress Notes (Signed)
Medical history reviewed with no changes noted. VS assessed by C.W 

## 2021-03-02 NOTE — Patient Instructions (Signed)
Information on hemorrhoids given to you today.  Resume previous diet and medications.  Repeat colonoscopy in 10 years.  YOU HAD AN ENDOSCOPIC PROCEDURE TODAY AT [facility]:   Refer to the procedure report that was given to you for any specific questions about what was found during the examination.  If the procedure report does not answer your questions, please call your gastroenterologist to clarify.  If you requested that your care partner not be given the details of your procedure findings, then the procedure report has been included in a sealed envelope for you to review at your convenience later.  YOU SHOULD EXPECT: Some feelings of bloating in the abdomen. Passage of more gas than usual.  Walking can help get rid of the air that was put into your GI tract during the procedure and reduce the bloating. If you had a lower endoscopy (such as a colonoscopy or flexible sigmoidoscopy) you may notice spotting of blood in your stool or on the toilet paper. If you underwent a bowel prep for your procedure, you may not have a normal bowel movement for a few days.  Please Note:  You might notice some irritation and congestion in your nose or some drainage.  This is from the oxygen used during your procedure.  There is no need for concern and it should clear up in a day or so.  SYMPTOMS TO REPORT IMMEDIATELY:  Following lower endoscopy (colonoscopy or flexible sigmoidoscopy):  Excessive amounts of blood in the stool  Significant tenderness or worsening of abdominal pains  Swelling of the abdomen that is new, acute  Fever of 100F or higher   For urgent or emergent issues, a gastroenterologist can be reached at any hour by calling [phone]. Do not use MyChart messaging for urgent concerns.    DIET:  We do recommend a small meal at first, but then you may proceed to your regular diet.  Drink plenty of fluids but you should avoid alcoholic beverages for 24 hours.  ACTIVITY:   You should plan to take it easy for the rest of today and you should NOT DRIVE or use heavy machinery until tomorrow (because of the sedation medicines used during the test).    FOLLOW UP: Our staff will call the number listed on your records 48-72 hours following your procedure to check on you and address any questions or concerns that you may have regarding the information given to you following your procedure. If we do not reach you, we will leave a message.  We will attempt to reach you two times.  During this call, we will ask if you have developed any symptoms of COVID 19. If you develop any symptoms (ie: fever, flu-like symptoms, shortness of breath, cough etc.) before then, please call [phone].  If you test positive for Covid 19 in the 2 weeks post procedure, please call and report this information to [place].    If any biopsies were taken you will be contacted by phone or by letter within the next 1-3 weeks.  Please call us at [phone] if you have not heard about the biopsies in 3 weeks.    SIGNATURES/CONFIDENTIALITY: You and/or your care partner have signed paperwork which will be entered into your electronic medical record.  These signatures attest to the fact that that the information above on your After Visit Summary has been reviewed and is understood.  Full responsibility of the confidentiality of this discharge information lies with you and/or your  care-partner.  

## 2021-03-02 NOTE — Progress Notes (Signed)
Report given to PACU, vss 

## 2021-03-02 NOTE — Op Note (Addendum)
[place] Patient Name: Becky Hatfield Procedure Date: 03/02/2021 1:20 PM MRN: [id number] Endoscopist: Thornton Park MD, MD Age: 55 Referring MD:  Date of Birth: 1965/10/26 Gender: Female Account #: [id number] Procedure:                Colonoscopy Indications:              Screening for colorectal malignant neoplasm, This                            is the patient's first colonoscopy                           No known family history of colon cancer or polyps Medicines:                Monitored Anesthesia Care Procedure:                Pre-Anesthesia Assessment:                           - Prior to the procedure, a History and Physical                            was performed, and patient medications and                            allergies were reviewed. The patient's tolerance of                            previous anesthesia was also reviewed. The risks                            and benefits of the procedure and the sedation                            options and risks were discussed with the patient.                            All questions were answered, and informed consent                            was obtained. Prior Anticoagulants: The patient has                            taken no previous anticoagulant or antiplatelet                            agents. ASA Grade Assessment: I - A normal, healthy                            patient. After reviewing the risks and benefits,                            the patient was deemed in satisfactory condition to  undergo the procedure.                           After obtaining informed consent, the colonoscope                            was passed under direct vision. Throughout the                            procedure, the patient's blood pressure, pulse, and                            oxygen saturations were monitored continuously. The                            Colonoscope was introduced through  the anus and                            advanced to the 3 cm into the ileum. A seond                            forward view of the right colon was performed. The                            colonoscopy was performed with moderate difficulty                            due to significant looping and a tortuous colon.                            Successful completion of the procedure was aided by                            changing the patient's position, withdrawing and                            reinserting the scope and applying abdominal                            pressure. The patient tolerated the procedure well.                            The quality of the bowel preparation was good. The                            terminal ileum, ileocecal valve, appendiceal                            orifice, and rectum were photographed. Scope In: 1:26:20 PM Scope Out: 1:43:38 PM Scope Withdrawal Time: 0 hours 10 minutes 34 seconds  Total Procedure Duration: 0 hours 17 minutes 18 seconds  Findings:                 The perianal and digital rectal examinations were  normal.                           The left colon was moderately tortuous.                           The exam was otherwise without abnormality on                            direct and retroflexion views except for                            hemorrhoids. Complications:            Nausea in the recovery room treated with Zofran. Estimated Blood Loss:     Estimated blood loss: none. Impression:               - Tortuous colon.                           - The examination was otherwise normal on direct                            and retroflexion views.                           - No specimens collected. Recommendation:           - Patient has a contact number available for                            emergencies. The signs and symptoms of potential                            delayed complications were discussed with  the                            patient. Return to normal activities tomorrow.                            Written discharge instructions were provided to the                            patient.                           - Resume previous diet.                           - Continue present medications.                           - Repeat colonoscopy in 10 years for surveillance.                           - Emerging evidence supports eating a diet of  fruits, vegetables, grains, calcium, and yogurt                            while reducing red meat and alcohol may reduce the                            risk of colon cancer.                           - Thank you for allowing me to be involved in your                            colon cancer prevention. Thornton Park MD, MD 03/02/2021 1:48:06 PM This report has been signed electronically.

## 2021-03-06 ENCOUNTER — Telehealth: Payer: Self-pay

## 2021-03-06 NOTE — Telephone Encounter (Signed)
  Follow up Call-  Call back number 03/02/2021  Post procedure Call Back phone  # [phone]  Permission to leave phone message Yes  Some recent data might be hidden     Patient questions:  Do you have a fever, pain , or abdominal swelling? No. Pain Score  0 *  Have you tolerated food without any problems? Yes.    Have you been able to return to your normal activities? Yes.    Do you have any questions about your discharge instructions: Diet   No. Medications  No. Follow up visit  No.  Do you have questions or concerns about your Care? No.  Actions: * If pain score is 4 or above: No action needed, pain <4.

## 2021-10-24 ENCOUNTER — Other Ambulatory Visit: Payer: Self-pay | Admitting: Obstetrics and Gynecology

## 2021-10-24 DIAGNOSIS — Z1231 Encounter for screening mammogram for malignant neoplasm of breast: Secondary | ICD-10-CM

## 2021-11-12 ENCOUNTER — Ambulatory Visit
Admission: RE | Admit: 2021-11-12 | Discharge: 2021-11-12 | Disposition: A | Discharge status: Home / Self Care | Payer: BC Managed Care – PPO | Source: Ambulatory Visit | Attending: Obstetrics and Gynecology | Admitting: Obstetrics and Gynecology

## 2021-11-12 DIAGNOSIS — Z1231 Encounter for screening mammogram for malignant neoplasm of breast: Secondary | ICD-10-CM | POA: Diagnosis present

## 2021-11-13 ENCOUNTER — Other Ambulatory Visit: Payer: Self-pay | Admitting: Obstetrics and Gynecology

## 2021-11-13 DIAGNOSIS — N63 Unspecified lump in unspecified breast: Secondary | ICD-10-CM

## 2021-11-13 DIAGNOSIS — R928 Other abnormal and inconclusive findings on diagnostic imaging of breast: Secondary | ICD-10-CM

## 2021-12-04 ENCOUNTER — Ambulatory Visit: Payer: BC Managed Care – PPO | Admitting: Dermatology

## 2021-12-04 DIAGNOSIS — M67442 Ganglion, left hand: Secondary | ICD-10-CM

## 2021-12-04 DIAGNOSIS — B070 Plantar wart: Secondary | ICD-10-CM

## 2021-12-04 DIAGNOSIS — L814 Other melanin hyperpigmentation: Secondary | ICD-10-CM

## 2021-12-04 DIAGNOSIS — M67449 Ganglion, unspecified hand: Secondary | ICD-10-CM

## 2021-12-04 DIAGNOSIS — L918 Other hypertrophic disorders of the skin: Secondary | ICD-10-CM

## 2021-12-04 NOTE — Progress Notes (Signed)
? ?New Patient Visit ? ?Subjective  ?Becky Hatfield is a 56 y.o. female who presents for the following: Warts (Left heel x years. Patient has used OTC topical medicine to treat. ) and Growth (Abdomen, rubbed by bra. Also spot on left thumb, possibly from pressure from writing.). ? ?The following portions of the chart were reviewed this encounter and updated as appropriate:  ?  ?  ? ?Review of Systems:  No other skin or systemic complaints except as noted in HPI or Assessment and Plan. ? ?Objective  ?Well appearing patient in no apparent distress; mood and affect are within normal limits. ? ?A focused examination was performed including face, left foot, abdomen. Relevant physical exam findings are noted in the Assessment and Plan. ? ?Left Plantar Surface of Heel ?Verrucous papules x 3 of the left heel - 1.0 cm, 0.6 cm and 0.4 cm -- Discussed viral etiology and contagion.  ? ?Left Thumb Proximal Nail Fold ?Firm flesh papule ? ? ? ?Assessment & Plan  ?Acrochordons (Skin Tags) ?- Fleshy, skin-colored pedunculated papule of the left upper abdomen ?- Benign appearing.  ?- Observe. ?- If desired, they can be removed with an in office procedure that is not covered by insurance. Pt defers removal today ?- Please call the clinic if you notice any new or changing lesions. ? ?Lentigines ?- Scattered tan macules ?- Due to sun exposure ?- Benign-appering, observe ?- Recommend daily broad spectrum sunscreen SPF 30+ to sun-exposed areas, reapply every 2 hours as needed. ?- Call for any changes  ? ?Plantar wart ?Left Plantar Surface of Heel ? ?Discussed viral etiology and risk of spread.  Discussed multiple treatments may be required to clear warts.  Discussed possible post-treatment dyspigmentation and risk of recurrence. ? ?Destruction of lesion - Left Plantar Surface of Heel ? ?Destruction method: cryotherapy   ?Informed consent: discussed and consent obtained   ?Lesion destroyed using liquid nitrogen: Yes   ?Region frozen until  ice ball extended beyond lesion: Yes   ?Outcome: patient tolerated procedure well with no complications   ?Post-procedure details: wound care instructions given   ?Additional details:  Prior to procedure, discussed risks of blister formation, small wound, skin dyspigmentation, or rare scar following cryotherapy. Recommend Vaseline ointment to treated areas while healing. ? ? ?Destruction of lesion - Left Plantar Surface of Heel ? ?Destruction method: chemical removal   ?Informed consent: discussed and consent obtained   ?Timeout:  patient name, date of birth, surgical site, and procedure verified ?Chemical destruction method: cantharidin   ?Application time:  6 hours ?Procedure instructions: patient instructed to wash and dry area   ?Outcome: patient tolerated procedure well with no complications   ?Post-procedure details: wound care instructions given   ?Additional details:  Cantharidin Plus is a blistering agent that comes from a beetle.  It needs to be washed off in about 4 hours after application.  Although it is painless when applied in office, it may cause symptoms of mild pain and burning several hours later.  Treated areas will swell and turn red, and blisters may form.  Vaseline and a bandaid may be applied until wound has healed.  Once healed, the skin may remain temporarily discolored.  It can take weeks to months for pigmentation to return to normal.  Advised to wash off with soap and water in 4 hours or sooner if it becomes tender before then.  ? ?Digital mucous cyst ?Left Thumb Proximal Nail Fold ? ?vs Callous from holding pencil while writing ? ?  Benign. Observation.  ? 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 ? ?Return in about 1 month (around 01/04/2022) for warts. ? ?I, Jamesetta Orleans, CMA, am acting as scribe for Brendolyn Patty, MD . ? ?Documentation: I have reviewed the above documentation for accuracy and completeness, and I agree with the above. ? ?Brendolyn Patty MD   ? ?

## 2021-12-04 NOTE — Patient Instructions (Addendum)
Curad Mediplast - In one week, apply patches to warts and replace as needed. Avoid using patches 3 days prior to next appointment.  ? ? ? ?Viral Warts & Molluscum Contagiosum ? ?Viral warts and molluscum contagiosum are growths of the skin caused by viral infection of the skin. If you have been given the diagnosis of viral warts or molluscum contagiosum there are a few things that you must understand about your condition: ? ?There is no guaranteed treatment method available for this condition. ?Multiple treatments may be required, ?The treatments may be time consuming and require multiple visits to the dermatology office. ?The treatment may be expensive. You will be charged each time you come into the office to have the spots treated. ?The treated areas may develop new lesions further complicating treatment. ?The treated areas may leave a scar. ?There is no guarantee that even after multiple treatments that the spots will be successfully treated. ?These are caused by a viral infection and can be spread to other areas of the skin and to other people by direct contact. Therefore, new spots may occur. ? ? ?Instructions for After In-Office Application of Cantharidin ? ?1. This is a strong medicine; please follow ALL instructions. ? ?2. Gently wash off with soap and water in four hours or sooner as directed by your physician. ? ?3. **WARNING** this medicine can cause severe blistering, blood blisters, infection, and/or scarring if it is not washed off as directed. ? ?4. Your progress will be rechecked in 1-2 months; call sooner if there are any questions or problems. ? ? ? ?If You Need Anything After Your Visit ? ?If you have any questions or concerns for your doctor, please call our main line at [phone] and press option 4 to reach your doctor's medical assistant. If no one answers, please leave a voicemail as directed and we will return your call as soon as possible. Messages left after 4 pm will be answered the  following business day.  ? ?You may also send [place] a message via MyChart. We typically respond to MyChart messages within 1-2 business days. ? ?For prescription refills, please ask your pharmacy to contact our office. Our fax number is [phone]. ? ?If you have an urgent issue when the clinic is closed that cannot wait until the next business day, you can page your doctor at the number below.   ? ?Please note that while we do our best to be available for urgent issues outside of office hours, we are not available 24/7.  ? ?If you have an urgent issue and are unable to reach [place], you may choose to seek medical care at your doctor's office, retail clinic, urgent care center, or emergency room. ? ?If you have a medical emergency, please immediately call 911 or go to the emergency department. ? ?Pager Numbers ? ?- Dr. Nehemiah Massed: [phone] ? ?- Dr. Laurence Ferrari: [phone] ? ?- Dr. Nicole Kindred: [phone] ? ?In the event of inclement weather, please call our main line at [phone] for an update on the status of any delays or closures. ? ?Dermatology Medication Tips: ?Please keep the boxes that topical medications come in in order to help keep track of the instructions about where and how to use these. Pharmacies typically print the medication instructions only on the boxes and not directly on the medication tubes.  ? ?If your medication is too expensive, please contact our office at [phone] option 4 or send [place] a message through [place].  ? ?We are unable to  tell what your co-pay for medications will be in advance as this is different depending on your insurance coverage. However, we may be able to find a substitute medication at lower cost or fill out paperwork to get insurance to cover a needed medication.  ? ?If a prior authorization is required to get your medication covered by your insurance company, please allow [place] 1-2 business days to complete this process. ? ?Drug prices often vary depending on where the  prescription is filled and some pharmacies may offer cheaper prices. ? ?The website www.goodrx.com contains coupons for medications through different pharmacies. The prices here do not account for what the cost may be with help from insurance (it may be cheaper with your insurance), but the website can give you the price if you did not use any insurance.  ?- You can print the associated coupon and take it with your prescription to the pharmacy.  ?- You may also stop by our office during regular business hours and pick up a GoodRx coupon card.  ?- If you need your prescription sent electronically to a different pharmacy, notify our office through [facility] or by phone at [phone] option 4. ? ? ? ? ?Si Usted Necesita Algo Despu?s de Su Visita ? ?Tambi?n puede enviarnos un mensaje a trav?s de MyChart. Por lo general respondemos a los mensajes de MyChart en el transcurso de 1 a 2 d?as h?biles. ? ?Para renovar recetas, por favor pida a su farmacia que se ponga en contacto con nuestra oficina. Nuestro n?mero de fax es el [phone]. ? ?Si tiene un asunto urgente cuando la cl?nica est? cerrada y que no puede esperar hasta el siguiente d?a h?bil, puede llamar/localizar a su doctor(a) al n?mero que aparece a continuaci?n.  ? ?Por favor, tenga en cuenta que aunque hacemos todo lo posible para estar disponibles para asuntos urgentes fuera del horario de oficina, no estamos disponibles las 24 horas del d?a, los 7 d?as de la semana.  ? ?Si tiene un problema urgente y no puede comunicarse con nosotros, puede optar por buscar atenci?n m?dica  en el consultorio de su doctor(a), en una cl?nica privada, en un centro de atenci?n urgente o en una sala de emergencias. ? ?Si tiene Engineer, maintenance (IT) m?dica, por favor llame inmediatamente al 911 o vaya a la sala de emergencias. ? ?N?meros de b?per ? ?- Dr. Nehemiah Massed: [phone] ? ?- Dra. Moye: [phone] ? ?- Dra. Nicole Kindred: [phone] ? ?En caso de inclemencias del tiempo,  por favor llame a nuestra l?nea principal al [phone] para una actualizaci?n sobre el estado de cualquier retraso o cierre. ? ?Consejos para la medicaci?n en dermatolog?a: ?Por favor, guarde las cajas en las que vienen los medicamentos de uso t?pico para ayudarle a seguir las instrucciones sobre d?nde y c?mo usarlos. Las farmacias generalmente imprimen las instrucciones del medicamento s?lo en las cajas y no directamente en los tubos del [place].  ? ?Si su medicamento es muy caro, por favor, p?ngase en contacto con Zigmund Daniel llamando al [phone] y presione la opci?n 4 o env?enos un mensaje a trav?s de MyChart.  ? ?No podemos decirle cu?l ser? su copago por los medicamentos por adelantado ya que esto es diferente dependiendo de la cobertura de su seguro. Sin embargo, es posible que podamos encontrar un medicamento sustituto a Electrical engineer un formulario para que el seguro cubra el medicamento que se considera necesario.  ? ?Si se requiere Ardelia Mems autorizaci?n previa para que su compa??a de seguros [place]  su medicamento, por favor perm?tanos de 1 a 2 d?as h?biles para completar este proceso. ? ?Los precios de los medicamentos var?an con frecuencia dependiendo del Environmental consultant de d?nde se surte la receta y alguna farmacias pueden ofrecer precios m?s baratos. ? ?El sitio web www.goodrx.com tiene cupones para medicamentos de Airline pilot. Los precios aqu? no tienen en cuenta lo que podr?a costar con la ayuda del seguro (puede ser m?s barato con su seguro), pero el sitio web puede darle el precio si no utiliz? ning?n seguro.  ?- Puede imprimir el cup?n correspondiente y llevarlo con su receta a la farmacia.  ?- Tambi?n puede pasar por nuestra oficina durante el horario de atenci?n regular y recoger una tarjeta de cupones de GoodRx.  ?- Si necesita que su receta se env?e electr?nicamente a Chiropodist, informe a nuestra oficina a trav?s de MyChart de [place] o por tel?fono llamando al  [phone] y presione la opci?n 4. ? ?

## 2021-12-05 ENCOUNTER — Ambulatory Visit
Admission: RE | Admit: 2021-12-05 | Discharge: 2021-12-05 | Disposition: A | Discharge status: Home / Self Care | Payer: BC Managed Care – PPO | Source: Ambulatory Visit | Attending: Obstetrics and Gynecology | Admitting: Obstetrics and Gynecology

## 2021-12-05 DIAGNOSIS — R928 Other abnormal and inconclusive findings on diagnostic imaging of breast: Secondary | ICD-10-CM | POA: Insufficient documentation

## 2021-12-05 DIAGNOSIS — N63 Unspecified lump in unspecified breast: Secondary | ICD-10-CM | POA: Diagnosis present

## 2022-01-09 ENCOUNTER — Ambulatory Visit: Payer: BC Managed Care – PPO | Admitting: Dermatology

## 2022-01-09 DIAGNOSIS — B070 Plantar wart: Secondary | ICD-10-CM

## 2022-01-09 NOTE — Progress Notes (Signed)
   Follow-Up Visit   Subjective  Becky Hatfield is a 56 y.o. female who presents for the following: Warts (L plantar surface of heel, 41mf/u, LN2, Cantharidin plus, mediplast).  Doing better.   The following portions of the chart were reviewed this encounter and updated as appropriate:       Review of Systems:  No other skin or systemic complaints except as noted in HPI or Assessment and Plan.  Objective  Well appearing patient in no apparent distress; mood and affect are within normal limits.  A focused examination was performed including left foot. Relevant physical exam findings are noted in the Assessment and Plan.  L plantar surface of heel x 2 (2) Verrucous papules 0.8cm and 0.6cm with thinning    Assessment & Plan  Plantar wart (2) L plantar surface of heel x 2  Discussed viral etiology and risk of spread.  Discussed multiple treatments may be required to clear warts.  Discussed possible post-treatment dyspigmentation and risk of recurrence.  LN2 x 2 Squaric Acid 3% x 2 Squaric Acid 3% applied to warts today. Prior to application reviewed risk of inflammation and irritation.  Cantharidin Plus x 2 Cont Mediplast once healed  Destruction of lesion - L plantar surface of heel x 2  Destruction method: cryotherapy   Informed consent: discussed and consent obtained   Lesion destroyed using liquid nitrogen: Yes   Region frozen until ice ball extended beyond lesion: Yes   Outcome: patient tolerated procedure well with no complications   Post-procedure details: wound care instructions given   Additional details:  Prior to procedure, discussed risks of blister formation, small wound, skin dyspigmentation, or rare scar following cryotherapy. Recommend Vaseline ointment to treated areas while healing.   Destruction of lesion - L plantar surface of heel x 2  Destruction method: chemical removal   Informed consent: discussed and consent obtained   Timeout:  patient name,  date of birth, surgical site, and procedure verified Chemical destruction method: cantharidin   Chemical destruction method comment:  Squaric Acid 3% x 2, Cantharidin Plus x 2 Application time:  6 hours Procedure instructions: patient instructed to wash and dry area   Outcome: patient tolerated procedure well with no complications   Post-procedure details: wound care instructions given   Additional details:  Patient advised to set alarm to remind them to wash off with soap and water at the directed time. Cantharidin Plus is a blistering agent that comes from a beetle.  It needs to be washed off in about 4-6 hours after application.  Although it is painless when applied in office, it may cause symptoms of mild pain and burning several hours later.  Treated areas will swell and turn red, and blisters may form.  Vaseline and a bandaid may be applied until wound has healed.  Once healed, the skin may remain temporarily discolored.  It can take weeks to months for pigmentation to return to normal.  Advised to wash off with soap and water in 4-6 hours or sooner if it becomes tender before then.    Return in about 1 month (around 02/08/2022) for wart f/u.  I, SOthelia Pulling RMA, am acting as scribe for TBrendolyn Patty MD .  Documentation: I have reviewed the above documentation for accuracy and completeness, and I agree with the above.  TBrendolyn PattyMD

## 2022-01-09 NOTE — Patient Instructions (Addendum)
Instructions for After In-Office Application of Cantharidin  1. This is a strong medicine; please follow ALL instructions.  2. Gently wash off with soap and water in six hours or sooner s directed by your physician.  3. **WARNING** this medicine can cause severe blistering, blood blisters, infection, and/or scarring if it is not washed off as directed.  4. Your progress will be rechecked in 1-2 months; call sooner if there are any questions or problems.   In 1 week restart mediplast to warts  Due to recent changes in healthcare laws, you may see results of your pathology and/or laboratory studies on MyChart before the doctors have had a chance to review them. We understand that in some cases there may be results that are confusing or concerning to you. Please understand that not all results are received at the same time and often the doctors may need to interpret multiple results in order to provide you with the best plan of care or course of treatment. Therefore, we ask that you please give [place] 2 business days to thoroughly review all your results before contacting the office for clarification. Should we see a critical lab result, you will be contacted sooner.   If You Need Anything After Your Visit  If you have any questions or concerns for your doctor, please call our main line at [phone] and press option 4 to reach your doctor's medical assistant. If no one answers, please leave a voicemail as directed and we will return your call as soon as possible. Messages left after 4 pm will be answered the following business day.   You may also send [place] a message via [place]. We typically respond to MyChart messages within 1-2 business days.  For prescription refills, please ask your pharmacy to contact our office. Our fax number is [phone].  If you have an urgent issue when the clinic is closed that cannot wait until the next business day, you can page your doctor at the number below.     Please note that while we do our best to be available for urgent issues outside of office hours, we are not available 24/7.   If you have an urgent issue and are unable to reach [place], you may choose to seek medical care at your doctor's office, retail clinic, urgent care center, or emergency room.  If you have a medical emergency, please immediately call 911 or go to the emergency department.  Pager Numbers  - Dr. Nehemiah Massed: [phone]  - Dr. Laurence Ferrari: [phone]  - Dr. Nicole Kindred: [phone]  In the event of inclement weather, please call our main line at [phone] for an update on the status of any delays or closures.  Dermatology Medication Tips: Please keep the boxes that topical medications come in in order to help keep track of the instructions about where and how to use these. Pharmacies typically print the medication instructions only on the boxes and not directly on the medication tubes.   If your medication is too expensive, please contact our office at [phone] option 4 or send [place] a message through [place].   We are unable to tell what your co-pay for medications will be in advance as this is different depending on your insurance coverage. However, we may be able to find a substitute medication at lower cost or fill out paperwork to get insurance to cover a needed medication.   If a prior authorization is required to get your medication covered by your insurance company, please allow  [place] 1-2 business days to complete this process.  Drug prices often vary depending on where the prescription is filled and some pharmacies may offer cheaper prices.  The website www.goodrx.com contains coupons for medications through different pharmacies. The prices here do not account for what the cost may be with help from insurance (it may be cheaper with your insurance), but the website can give you the price if you did not use any insurance.  - You can print the associated coupon and take  it with your prescription to the pharmacy.  - You may also stop by our office during regular business hours and pick up a GoodRx coupon card.  - If you need your prescription sent electronically to a different pharmacy, notify our office through [facility] or by phone at [phone] option 4.     Si Usted Necesita Algo Despus de Su Visita  Tambin puede enviarnos un mensaje a travs de Pharmacist, community. Por lo general respondemos a los mensajes de MyChart en el transcurso de 1 a 2 das hbiles.  Para renovar recetas, por favor pida a su farmacia que se ponga en contacto con nuestra oficina. Harland Dingwall de fax es Secretary [phone].  Si tiene un asunto urgente cuando la clnica est cerrada y que no puede esperar hasta el siguiente da hbil, puede llamar/localizar a su doctor(a) al nmero que aparece a continuacin.   Por favor, tenga en cuenta que aunque hacemos todo lo posible para estar disponibles para asuntos urgentes fuera del horario de [place], no estamos disponibles las 24 horas del da, los 7 das de la [place].   Si tiene un problema urgente y no puede comunicarse con nosotros, puede optar por buscar atencin mdica  en el consultorio de su doctor(a), en una clnica privada, en un centro de atencin urgente o en una sala de emergencias.  Si tiene Engineering geologist, por favor llame inmediatamente al 911 o vaya a la sala de emergencias.  Nmeros de bper  - Dr. Nehemiah Massed: [phone]  - Dra. Moye: [phone]  - Dra. Nicole Kindred: [phone]  En caso de inclemencias del [place], por favor llame a Johnsie Kindred principal al [phone] para una actualizacin sobre el [place] de cualquier retraso o cierre.  Consejos para la medicacin en dermatologa: Por favor, guarde las cajas en las que vienen los medicamentos de uso tpico para ayudarle a seguir las instrucciones sobre dnde y cmo usarlos. Las farmacias generalmente imprimen las instrucciones del medicamento slo en las  cajas y no directamente en los tubos del [place].   Si su medicamento es muy caro, por favor, pngase en contacto con Zigmund Daniel llamando al [phone] y presione la opcin 4 o envenos un mensaje a travs de Pharmacist, community.   No podemos decirle cul ser su copago por los medicamentos por adelantado ya que esto es diferente dependiendo de la cobertura de su seguro. Sin embargo, es posible que podamos encontrar un medicamento sustituto a Electrical engineer un formulario para que el seguro cubra el medicamento que se considera necesario.   Si se requiere una autorizacin previa para que su compaa de seguros [place] su medicamento, por favor permtanos de 1 a 2 das hbiles para completar este proceso.  Los precios de los medicamentos varan con frecuencia dependiendo del Environmental consultant de dnde se surte la receta y alguna farmacias pueden ofrecer precios ms baratos.  El sitio web www.goodrx.com tiene cupones para medicamentos de Airline pilot. Los precios aqu no tienen en cuenta lo que podra  lo que podra costar con la ayuda del seguro (puede ser ms barato con su seguro), pero el sitio web puede darle el precio si no utiliz ningn seguro.  - Puede imprimir el cupn correspondiente y llevarlo con su receta a la farmacia.  - Tambin puede pasar por nuestra oficina durante el horario de atencin regular y recoger una tarjeta de cupones de GoodRx.  - Si necesita que su receta se enve electrnicamente a una farmacia diferente, informe a nuestra oficina a travs de MyChart de [place] o por telfono llamando al [phone] y presione la opcin 4.  

## 2022-02-07 ENCOUNTER — Ambulatory Visit: Payer: BC Managed Care – PPO | Admitting: Dermatology

## 2022-02-07 DIAGNOSIS — B070 Plantar wart: Secondary | ICD-10-CM | POA: Diagnosis not present

## 2022-02-07 NOTE — Progress Notes (Signed)
   Follow-Up Visit   Subjective  Becky Hatfield is a 56 y.o. female who presents for the following: Warts (Patient here today for 1 month wart follow up. Treated with Ln2, cantharidin plus, squaric 3 % acid and mediplast. Patient reports areas have improved. ).  The following portions of the chart were reviewed this encounter and updated as appropriate:      Review of Systems: No other skin or systemic complaints except as noted in HPI or Assessment and Plan.   Objective  Well appearing patient in no apparent distress; mood and affect are within normal limits.  A focused examination was performed including left foot. Relevant physical exam findings are noted in the Assessment and Plan.  Left Plantar Surface of Heel  x 1 Small thin verrucous macule c/w residual wart    Assessment & Plan  Plantar wart Left Plantar Surface of Heel  x 1  Discussed viral etiology and risk of spread.  Discussed multiple treatments may be required to clear warts.  Discussed possible post-treatment dyspigmentation and risk of recurrence.     Destruction of lesion - Left Plantar Surface of Heel  x 1  Destruction method: cryotherapy   Informed consent: discussed and consent obtained   Lesion destroyed using liquid nitrogen: Yes   Region frozen until ice ball extended beyond lesion: Yes   Outcome: patient tolerated procedure well with no complications   Post-procedure details: wound care instructions given   Additional details:  Prior to procedure, discussed risks of blister formation, small wound, skin dyspigmentation, or rare scar following cryotherapy. Recommend Vaseline ointment to treated areas while healing.    Return if symptoms worsen or fail to improve. I, Ruthell Rummage, CMA, am acting as scribe for Brendolyn Patty, MD.  Documentation: I have reviewed the above documentation for accuracy and completeness, and I agree with the above.  Brendolyn Patty MD

## 2022-02-07 NOTE — Patient Instructions (Addendum)
Cryotherapy Aftercare  Wash gently with soap and water everyday.   Apply Vaseline and Band-Aid daily until healed.     Due to recent changes in healthcare laws, you may see results of your pathology and/or laboratory studies on MyChart before the doctors have had a chance to review them. We understand that in some cases there may be results that are confusing or concerning to you. Please understand that not all results are received at the same time and often the doctors may need to interpret multiple results in order to provide you with the best plan of care or course of treatment. Therefore, we ask that you please give us 2 business days to thoroughly review all your results before contacting the office for clarification. Should we see a critical lab result, you will be contacted sooner.   If You Need Anything After Your Visit  If you have any questions or concerns for your doctor, please call our main line at [phone] and press option 4 to reach your doctor's medical assistant. If no one answers, please leave a voicemail as directed and we will return your call as soon as possible. Messages left after 4 pm will be answered the following business day.   You may also send us a message via MyChart. We typically respond to MyChart messages within 1-2 business days.  For prescription refills, please ask your pharmacy to contact our office. Our fax number is [phone].  If you have an urgent issue when the clinic is closed that cannot wait until the next business day, you can page your doctor at the number below.    Please note that while we do our best to be available for urgent issues outside of office hours, we are not available 24/7.   If you have an urgent issue and are unable to reach us, you may choose to seek medical care at your doctor's office, retail clinic, urgent care center, or emergency room.  If you have a medical emergency, please immediately call 911 or go to the  emergency department.  Pager Numbers  - Dr. Kowalski: [phone]  - Dr. Moye: [phone]  - Dr. Stewart: [phone]  In the event of inclement weather, please call our main line at [phone] for an update on the status of any delays or closures.  Dermatology Medication Tips: Please keep the boxes that topical medications come in in order to help keep track of the instructions about where and how to use these. Pharmacies typically print the medication instructions only on the boxes and not directly on the medication tubes.   If your medication is too expensive, please contact our office at [phone] option 4 or send us a message through MyChart.   We are unable to tell what your co-pay for medications will be in advance as this is different depending on your insurance coverage. However, we may be able to find a substitute medication at lower cost or fill out paperwork to get insurance to cover a needed medication.   If a prior authorization is required to get your medication covered by your insurance company, please allow us 1-2 business days to complete this process.  Drug prices often vary depending on where the prescription is filled and some pharmacies may offer cheaper prices.  The website www.goodrx.com contains coupons for medications through different pharmacies. The prices here do not account for what the cost may be with help from insurance (it may be cheaper with your insurance), but the website can   give you the price if you did not use any insurance.  - You can print the associated coupon and take it with your prescription to the pharmacy.  - You may also stop by our office during regular business hours and pick up a GoodRx coupon card.  - If you need your prescription sent electronically to a different pharmacy, notify our office through [facility] MyChart or by phone at [phone] option 4.     Si Usted Necesita Algo Despus de Su Visita  Tambin puede  enviarnos un mensaje a travs de MyChart. Por lo general respondemos a los mensajes de MyChart en el transcurso de 1 a 2 das hbiles.  Para renovar recetas, por favor pida a su farmacia que se ponga en contacto con nuestra oficina. Nuestro nmero de fax es el [phone].  Si tiene un asunto urgente cuando la clnica est cerrada y que no puede esperar hasta el siguiente da hbil, puede llamar/localizar a su doctor(a) al nmero que aparece a continuacin.   Por favor, tenga en cuenta que aunque hacemos todo lo posible para estar disponibles para asuntos urgentes fuera del horario de oficina, no estamos disponibles las 24 horas del da, los 7 das de la semana.   Si tiene un problema urgente y no puede comunicarse con nosotros, puede optar por buscar atencin mdica  en el consultorio de su doctor(a), en una clnica privada, en un centro de atencin urgente o en una sala de emergencias.  Si tiene una emergencia mdica, por favor llame inmediatamente al 911 o vaya a la sala de emergencias.  Nmeros de bper  - Dr. Kowalski: [phone]  - Dra. Moye: [phone]  - Dra. Stewart: [phone]  En caso de inclemencias del tiempo, por favor llame a nuestra lnea principal al [phone] para una actualizacin sobre el estado de cualquier retraso o cierre.  Consejos para la medicacin en dermatologa: Por favor, guarde las cajas en las que vienen los medicamentos de uso tpico para ayudarle a seguir las instrucciones sobre dnde y cmo usarlos. Las farmacias generalmente imprimen las instrucciones del medicamento slo en las cajas y no directamente en los tubos del medicamento.   Si su medicamento es muy caro, por favor, pngase en contacto con nuestra oficina llamando al [phone] y presione la opcin 4 o envenos un mensaje a travs de MyChart.   No podemos decirle cul ser su copago por los medicamentos por adelantado ya que esto es diferente dependiendo de la cobertura de su seguro.  Sin embargo, es posible que podamos encontrar un medicamento sustituto a menor costo o llenar un formulario para que el seguro cubra el medicamento que se considera necesario.   Si se requiere una autorizacin previa para que su compaa de seguros cubra su medicamento, por favor permtanos de 1 a 2 das hbiles para completar este proceso.  Los precios de los medicamentos varan con frecuencia dependiendo del lugar de dnde se surte la receta y alguna farmacias pueden ofrecer precios ms baratos.  El sitio web www.goodrx.com tiene cupones para medicamentos de diferentes farmacias. Los precios aqu no tienen en cuenta lo que podra costar con la ayuda del seguro (puede ser ms barato con su seguro), pero el sitio web puede darle el precio si no utiliz ningn seguro.  - Puede imprimir el cupn correspondiente y llevarlo con su receta a la farmacia.  - Tambin puede pasar por nuestra oficina durante el horario de atencin regular y recoger una tarjeta de cupones de GoodRx.  -   Si necesita que su receta se enve electrnicamente a una farmacia diferente, informe a nuestra oficina a travs de MyChart de [place] o por telfono llamando al [phone] y presione la opcin 4.  

## 2022-11-26 ENCOUNTER — Other Ambulatory Visit: Payer: Self-pay | Admitting: Student

## 2022-11-26 DIAGNOSIS — H9042 Sensorineural hearing loss, unilateral, left ear, with unrestricted hearing on the contralateral side: Secondary | ICD-10-CM

## 2022-11-28 ENCOUNTER — Ambulatory Visit (HOSPITAL_COMMUNITY)
Admission: EM | Admit: 2022-11-28 | Discharge: 2022-11-28 | Disposition: A | Discharge status: Home / Self Care | Payer: BC Managed Care – PPO | Attending: Family Medicine | Admitting: Family Medicine

## 2022-11-28 ENCOUNTER — Inpatient Hospital Stay
Admission: RE | Admit: 2022-11-28 | Discharge: 2022-11-28 | Disposition: A | Discharge status: Home / Self Care | Payer: BC Managed Care – PPO | Source: Ambulatory Visit | Attending: Family Medicine | Admitting: Family Medicine

## 2022-11-28 DIAGNOSIS — F4322 Adjustment disorder with anxiety: Secondary | ICD-10-CM | POA: Diagnosis not present

## 2022-11-28 MED ORDER — HYDROXYZINE HCL 10 MG PO TABS: 10.0000 mg | ORAL_TABLET | Freq: Three times a day (TID) | ORAL | 0 refills | Status: DC | PRN

## 2022-11-28 NOTE — Discharge Instructions (Addendum)
I have referred you to [facility] for mental health care, their contact information is listed on outpatient.  [facility] is a Consulting civil engineer for mental health counseling:    Same-day appointments Operating hours and clinician availability may delay appointments until the next business day. [facility] [place] and Current Patients  Psychiatry hours Monday-Friday, 8am-4:30pm (Eastern Time) If you are experiencing problems accessing [facility] send email to: [email or web address] or call [phone], Monday-Friday, 8:00am-4:30pm If you are having a psychiatric or medical emergency, please call 911 or go to the nearest emergency department. To reach the Suicide and Crisis Lifeline, please call or text [phone].  What is [facility]?  [facility] is an online service that provides same-day access to psychiatry to meet urgent mental health needs. The goal is to provide patients with timely intervention and keep them in an outpatient setting.  How long will I wait to see a clinician?  Our goal is to provide same-day care. However, operating hours and clinician availability may delay appointments until the next business day.  What if I can't get an appointment?  Please call [facility] at [phone] 8am to 5pm [place] Time or email [place]:  [email or web address]  to request the next available time. If you are having a psychiatric or medical emergency, please call 911 or go to the nearest emergency department. To reach the Suicide and Crisis Lifeline, please call or text [phone]. Do you accept insurance?  Yes. We accept most commercial insurance plans. What information do you need from me? New patients should be ready to provide:  Photo ID  Insurance card  Payment information  For current patients, our specialists will confirm your documentation is on file.   Can I continue with regular care after my [facility] session?  Yes. Our goal is  to make sure you have the follow-up care you need. Our specialist can help you schedule an appointment with an ongoing provider in addition to your On Demand session.  Can my current clinician provide treatment on [facility]?  No. Our [facility] clinicians are trained to assist with more immediate needs and provide support between regular appointments with your clinician.  What device can I use to connect with [facility]?  You can use any Wi-Fi-enabled device with a camera and a microphone, such as a smartphone, tablet, or computer.  Do I have to be at home to connect with [facility]?  No. As long as you are located within [place] or [place] you can connect with a provider. We do request that you connect from a safe and private location. Your clinician is required to document your location for emergency purposes.

## 2022-11-28 NOTE — ED Notes (Signed)
Patient discharged by provider Kimberly Harris, NP with written and verbal instructions. Resources provided.  

## 2022-11-28 NOTE — ED Provider Notes (Addendum)
Behavioral Health Urgent Care Medical Screening Exam  Patient Name: Becky Hatfield MRN: [id number] Date of Evaluation: 11/28/22 Chief Complaint:  " I worry about everything" Diagnosis:  Final diagnoses:  Adjustment disorder with anxiety    Becky Hatfield 57 y.o., female patient presented to [facility] as a walk in, voluntarily accompanied by her husband with complaints of worsening memory and anxiety over the past few months due to several things occurring within the family unit.    Becky Hatfield, 57 y.o., female patient seen face to face by this provider, consulted with Dr. Lucianne Muss; and chart reviewed on 11/28/22.    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  Beginning the month of December 2023 while patient's twin daughters got married and moved to [place] and this was the first time her children have not lived close by, subsequently her mother-in-law passed away in 09/28/2022, her daughter miscarried their granddaughter a few weeks ago, and her mother is currently in the care of hospice and actively dying.  She reports over the last few months her anxiety and worry has worsened.  She denies any difficulty sleeping.  But reports persistent worry about things that she is aware that she has no control of.  She endorses having a [place] faith and has also been utilizing prayer as a method and coping with her anxiety.  She reports over the last week feeling that she needs some medication and therapy as things seem to be becoming more difficult to manage.  During evaluation Becky Hatfield is  in no acute distress.  She is alert, oriented x 4, calm, cooperative and attentive.  Her mood is mildly anxious euthymic with congruent affect.  She has normal speech, and behavior.  Objectively there is  no evidence of psychosis/mania or delusional thinking.  Patient is able to converse coherently, goal directed thoughts, no distractibility, or pre-occupation.  She also denies suicidal/self-harm/homicidal ideation, psychosis, and paranoia.  Patient answered question appropriately.   Patient is able to contract for safety.  Flowsheet Row ED from 11/28/2022 in [facility]  C-SSRS RISK CATEGORY No Risk       Psychiatric Specialty Exam  Presentation  General Appearance:Appropriate for Environment  Eye Contact:Good  Speech:Clear and Coherent  Speech Volume:Normal  Handedness:Right   Mood and Affect  Mood: Anxious  Affect: Tearful; Other (comment)   Thought Process  Thought Processes: Coherent  Descriptions of Associations:Intact  Orientation:Full (Time, Place and Person)  Thought Content:Logical    Hallucinations:None  Ideas of Reference:None  Suicidal Thoughts:No  Homicidal Thoughts:No   Sensorium  Memory: Immediate Good; Recent Good; Remote Good  Judgment: Good  Insight: Good   Executive Functions  Concentration: Good  Attention Span: Good  Recall: Good  Fund of Knowledge: Good  Language: Good   Psychomotor Activity  Psychomotor Activity: Normal   Assets  Assets: Communication Skills; Desire for Improvement; Housing; Resilience; Social Support   Sleep  Sleep: Good  Number of hours:  7   Physical Exam: Physical Exam HENT:     Head: Normocephalic.  Eyes:     Extraocular Movements: Extraocular movements intact.     Pupils: Pupils are equal, round, and reactive to light.  Cardiovascular:     Rate and Rhythm: Normal  rate.  Pulmonary:     Effort: Pulmonary effort is normal.  Musculoskeletal:        General: Normal range of motion.     Cervical back: Normal range of motion.  Neurological:     General: No focal deficit present.     Mental Status: She is alert.    Review of Systems   Psychiatric/Behavioral:  The patient is nervous/anxious.    Blood pressure 138/85, pulse 81, temperature 98.1 F (36.7 C), temperature source Oral, resp. rate 18, last menstrual period 10/02/2015, SpO2 97 %. There is no height or weight on file to calculate BMI.     [facility] MSE Discharge Disposition for Follow up and Recommendations: Based on my evaluation the patient does not appear to have an emergency medical condition and can be discharged with resources and follow up care in outpatient services for Referral placed to [facility] for medication management and counseling services. Agreed to trial a short supply of hydroxyzine 10 mg 3 times daily as needed for anxiety.  Discussed side effects including sedation patient will initiate first dose at home prior to bedtime.  Return precautions given.   Joaquin Courts, NP 11/28/2022, 6:16 PM

## 2022-11-28 NOTE — Progress Notes (Signed)
   11/28/22 1629  [facility] Triage Screening (Walk-ins at [facility] only)  How Did You Hear About [place]? Family/Friend  What Is the Reason for Your Visit/Call Today? Becky Hatfield is a 57 y/o female presenting to the [facility] as a walk-in. She is accompanied by her spouse "Becky Hatfield". Referred by a PCP in [place]. Patient with a complaint of worsening anxiety and stress. States that she has experienced issues with worrying her entire life. However, recently she has felt overwhelmed with anxiety. 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 Her daughter recently had a miscarriage. Patient's mother isn't expected to live past the next 24 hours. Her mother in law passed away 09-13-22. Symptoms from her stress include hearing loss which is being treated with Prednisone, "my heart pounds when I wake up I the morning", etc. She has tried to get an appointment with a psychiatrist/therapist for medication management. However, says that she has been turned away for various reasons (providers not taking new patients, "because I am to old", etc.). Patient denies SI, HI, AVH's, alcohol/drug use. She is requesting anti anxiety medications and assistance with finding a psychiatric provider. Lives with spouse and identifies him as her support system.  How Long Has This Been Causing You Problems? 1 wk - 1 month  Have You Recently Had Any Thoughts About Hurting Yourself? No  Are You Planning to Commit Suicide/Harm Yourself At This time? No  Have you Recently Had Thoughts About Hurting Someone Becky Hatfield? No  Are You Planning To Harm Someone At This Time? No  Have You Used Any Alcohol or Drugs in the Past 24 Hours? No  Do you have any current medical co-morbidities that require immediate attention? No  Clinician description of patient physical appearance/behavior: Calm and cooperative. Very anxious.  What Do You Feel Would Help You the Most Today? Treatment for Depression or other mood  problem;Medication(s);Stress Management  If access to Becky Hatfield was not available, would you have sought Hatfield in the Emergency Department? No  Determination of Need Routine (7 days)  Options For Referral Medication Management;Intensive Outpatient Therapy;Partial Hospitalization

## 2022-12-09 ENCOUNTER — Telehealth (HOSPITAL_COMMUNITY): Payer: Self-pay | Admitting: Family Medicine

## 2022-12-09 MED ORDER — HYDROXYZINE HCL 10 MG PO TABS: 10.0000 mg | ORAL_TABLET | Freq: Three times a day (TID) | ORAL | 0 refills | Status: AC | PRN

## 2022-12-09 NOTE — Telephone Encounter (Signed)
Patient called requesting additional supply hydroxyzine. Has an appointment with a new psych provider in a month. Agreed to extend prescription and refilled with 30 qty. Sent to pharmacy on file. My chart message sent to notify patient of prescription.

## 2022-12-10 ENCOUNTER — Inpatient Hospital Stay: Admission: RE | Admit: 2022-12-10 | Payer: BC Managed Care – PPO | Source: Ambulatory Visit

## 2022-12-30 NOTE — Progress Notes (Addendum)
Psychiatric Initial Adult Assessment   Patient Identification: Becky Hatfield MRN:  [id number] Date of Evaluation:  01/06/2023 Referral Source: Bing Neighbors, NP  Chief Complaint:   Chief Complaint  Patient presents with   Establish Care   Visit Diagnosis:    ICD-10-CM   1. GAD (generalized anxiety disorder)  F41.1 TSH      History of Present Illness:   Becky Hatfield is a 57 y.o. year old female with a history of anxiety, who is referred for anxiety.  Accordig to the chart review, she presented to [facility] as a walk in for worsening memory and anxiety. She was prescribed hydroxyzine 10 mg three times a day.   She states that she has been worried about things she cannot control for many years.  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  She lost her mother-in-law this January, She lost her mother as well. Her daughter in [place] had a miscarriage, and lost a baby, who may likely had down syndrome.  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  She was prescribed prednisone for this.  She experienced intense anxiety with palpitation. She went to urgent care due to these symptoms.  She was prescribed hydroxyzine.  Although there were times she had to take 3 times a day for anxiety, it has been getting much better.  She took twice a few days ago, and has not taken any since then.  She loves her job and loves kids.  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 Her children went to the school she teaches, and it is like a family to her.  She reports great relationship with her husband, who is supportive. She politely states that she does not think she is severe enough to take medication every day.   Anxiety-she was feeling anxious the other time.  She mentioned an incident concerning her daughter, who encountered two men at a gas station, which caused her concern. She brought up  this conversation with her daughter a few months after the incident occurred.   Depression-she feels sad secondary to grief.  She sleeps up to six hours.  Although she gained some weight since since not being on keto diet, she is not concerned about the current weight. She enjoys listening to music, going to the lake, and reading.  She denies SI.    Substance use  Tobacco Alcohol Other substances/  Current denies denies denies  Past denies denies denies  Past Treatment         Wt Readings from Last 3 Encounters:  01/06/23 150 lb 9.6 oz (68.3 kg)  03/02/21 124 lb (56.2 kg)  02/19/21 129 lb (58.5 kg)    Support: husband Household: husband Marital status: married Number of children: 3 (2 daughters, son) Employment: middle Engineer, site at [employer] Education:  Tax adviser in elementary education   Associated Signs/Symptoms: Depression Symptoms:  anxiety, (Hypo) Manic Symptoms:   decreased need for sleep, euphoria Anxiety Symptoms:   mild anxiety Psychotic Symptoms:   denies AH, VH, paranoia PTSD Symptoms: Negative  Past Psychiatric History:  Outpatient:  Psychiatry admission: denies Previous suicide attempt: denies Past trials of medication: denies History of violence:  History of head injury:   Previous Psychotropic Medications: Yes   Substance Abuse History in the last 12 months:  No.  Consequences of Substance Abuse: NA  Past Medical History:  Past Medical History:  Diagnosis Date   Allergy    seasonal   Kidney stones    x4   Renal disorder     Past Surgical History:  Procedure Laterality Date   BREAST BIOPSY Right 10/21/2016   FIBROCYSTIC CHANGES INCLUDING USUAL DUCTAL Hyperplasia, PASH, Fibroadenomatoid changes   TUBAL LIGATION      Family Psychiatric History: as below  Family History:  Family History  Problem Relation Age of Onset   Hypertension Mother    Stroke Mother    Hypertension Father    Anuerysm Father    Breast cancer Neg Hx     Colon cancer Neg Hx    Colon polyps Neg Hx    Esophageal cancer Neg Hx    Stomach cancer Neg Hx    Rectal cancer Neg Hx     Social History:   Social History   Socioeconomic History   Marital status: Married    Spouse name: Not on file   Number of children: 3   Years of education: Not on file   Highest education level: Bachelor's degree (e.g., BA, AB, BS)  Occupational History   Not on file  Tobacco Use   Smoking status: Never   Smokeless tobacco: Never  Vaping Use   Vaping Use: Never used  Substance and Sexual Activity   Alcohol use: No    Alcohol/week: 0.0 standard drinks of alcohol   Drug use: No   Sexual activity: Yes    Partners: Male    Birth control/protection: Surgical  Other Topics Concern   Not on file  Social History Narrative   Not on file   Social Determinants of Health   Financial Resource Strain: Not on file  Food Insecurity: Not on file  Transportation Needs: Not on file  Physical Activity: Not on file  Stress: Not on file  Social Connections: Not on file    Additional Social History: as above  Allergies:  No Known Allergies  Metabolic Disorder Labs: Lab Results  Component Value Date   HGBA1C 5.6 09/18/2020   No results found for: "PROLACTIN" Lab Results  Component Value Date   CHOL 246 (H) 09/18/2020   TRIG 45 09/18/2020   HDL 66 09/18/2020   CHOLHDL 3.7 09/18/2020   VLDL 15 07/31/2016   LDLCALC 173 (H) 09/18/2020   LDLCALC 140 (H) 09/08/2017   Lab Results  Component Value Date   TSH 1.270 09/18/2020    Therapeutic Level Labs: No results found for: "LITHIUM" No results found for: "CBMZ" No results found for: "VALPROATE"  Current Medications: Current Outpatient Medications  Medication Sig Dispense Refill   Chlorphen-PE-Acetaminophen ([facility] SINUS PAIN/CONGEST NIGHT PO) Take by mouth as needed. PRN only     clobetasol ointment (TEMOVATE) 0.05 % Apply to affected area every night for 4 weeks, then every other day for 4 weeks and  then twice a week for 4 weeks or until resolution 30 g 4   hydrOXYzine (ATARAX) 10 MG tablet Take 1 tablet (10 mg total) by mouth 3 (three) times daily as needed for anxiety. 20 tablet 0   hydrOXYzine (ATARAX) 10 MG tablet Take 1 tablet (10 mg total) by mouth 3 (three) times daily as needed for anxiety. 30 tablet 0   No current facility-administered medications for this visit.    Musculoskeletal: Strength & Muscle Tone: within normal limits Gait & Station: normal Patient leans: N/A  Psychiatric Specialty Exam: Review of Systems  Psychiatric/Behavioral:  Negative for agitation,  behavioral problems, confusion, decreased concentration, dysphoric mood, hallucinations, self-injury, sleep disturbance and suicidal ideas. The patient is nervous/anxious. The patient is not hyperactive.   All other systems reviewed and are negative.   Blood pressure 118/70, pulse 80, temperature 98.7 F (37.1 C), temperature source Skin, height 5\' 4"  (1.626 m), weight 150 lb 9.6 oz (68.3 kg), last menstrual period 10/02/2015.Body mass index is 25.85 kg/m.  General Appearance: Fairly Groomed  Eye Contact:  Good  Speech:  Clear and Coherent  Volume:  Normal  Mood:   good  Affect:  Appropriate, Congruent, and Full Range  Thought Process:  Coherent  Orientation:  Full (Time, Place, and Person)  Thought Content:  Logical  Suicidal Thoughts:  No  Homicidal Thoughts:  No  Memory:  Immediate;   Good  Judgement:  Good  Insight:  Good  Psychomotor Activity:  Normal  Concentration:  Concentration: Good and Attention Span: Good  Recall:  Good  Fund of Knowledge:Good  Language: Good  Akathisia:  No  Handed:  Right  AIMS (if indicated):  not done  Assets:  Communication Skills Desire for Improvement  ADL's:  Intact  Cognition: WNL  Sleep:  Good   Screenings: GAD-7    Flowsheet Row Office Visit from 01/06/2023 in [facility] Office Visit from 04/08/2019 in [place] [facility] [place]  Total GAD-7 Score 3 1      PHQ2-9    Flowsheet Row Office Visit from 01/06/2023 in [facility] Office Visit from 09/08/2017 in [facility] Total Score 0 0  PHQ-9 Total Score -- 0      Flowsheet Row ED from 11/28/2022 in [facility]  C-SSRS RISK CATEGORY No Risk       Assessment and Plan:  Becky Hatfield is a 57 y.o. year old female with a history of anxiety, who is referred for anxiety.   1. GAD (generalized anxiety disorder) Acute stressors include: loss of her mother in law in Jan 2024, mother, her daughter's miscarriage in April 2024  Other stressors include:     History:suffering from anxiety for many years, not required pharmacological treatment   Although she reports significant worsening in anxiety in the setting of stressors as above, it has been overall improving for the past few months.  She reports great benefit from hydroxyzine, which she has not needed to take consistently.   After discussing treatment options and considering starting an antidepressant to target anxiety, she expressed a preference to continue with hydroxyzine only until the next visit.  Will continue current dose to target anxiety.  Will obtain labs to rule out medical health issues contributing to her symptoms  Plan Continue hydroxyzine 10 mg three times a day as needed for anxiety (she had to take only up to twice a day at times. She will call if she needs a refill) Obtain TSH Next appointment: 8/5 at 3:30 for 30 mins, IP  The patient demonstrates the following risk factors for suicide: Chronic risk factors for suicide include: psychiatric disorder of anxiety . Acute risk factors for suicide include: loss (financial, interpersonal, professional). Protective factors for this patient include: positive social support, coping skills, and hope for the  future. Considering these factors, the overall suicide risk at this point appears to be low. Patient is appropriate for outpatient follow up.   Collaboration of Care: Other reviewed notes in Epic  Patient/Guardian was advised Release of Information must be obtained prior to any record release in order to collaborate their care with an outside provider. Patient/Guardian was advised if they have not already done so to contact the registration department to sign all necessary forms in order for [place] to release information regarding their care.   Consent: Patient/Guardian gives verbal consent for treatment and assignment of benefits for services provided during this visit. Patient/Guardian expressed understanding and agreed to proceed.   Neysa Hotter, MD 6/3/20243:18 PM

## 2023-01-06 ENCOUNTER — Encounter: Payer: Self-pay | Admitting: Psychiatry

## 2023-01-06 ENCOUNTER — Other Ambulatory Visit: Payer: Self-pay | Admitting: Obstetrics and Gynecology

## 2023-01-06 ENCOUNTER — Other Ambulatory Visit: Payer: Self-pay | Admitting: Family Medicine

## 2023-01-06 ENCOUNTER — Ambulatory Visit: Payer: BC Managed Care – PPO | Admitting: Psychiatry

## 2023-01-06 VITALS — BP 118/70 | HR 80 | Temp 98.7°F | Ht 64.0 in | Wt 150.6 lb

## 2023-01-06 DIAGNOSIS — F411 Generalized anxiety disorder: Secondary | ICD-10-CM

## 2023-01-06 DIAGNOSIS — Z1231 Encounter for screening mammogram for malignant neoplasm of breast: Secondary | ICD-10-CM

## 2023-01-08 ENCOUNTER — Encounter: Payer: Self-pay | Admitting: Psychiatry

## 2023-01-08 LAB — TSH: TSH: 1.53 u[IU]/mL (ref 0.450–4.500)

## 2023-01-15 ENCOUNTER — Ambulatory Visit
Admission: RE | Admit: 2023-01-15 | Discharge: 2023-01-15 | Disposition: A | Discharge status: Home / Self Care | Payer: BC Managed Care – PPO | Source: Ambulatory Visit | Attending: Obstetrics and Gynecology | Admitting: Obstetrics and Gynecology

## 2023-01-15 DIAGNOSIS — Z1231 Encounter for screening mammogram for malignant neoplasm of breast: Secondary | ICD-10-CM | POA: Insufficient documentation

## 2023-02-20 ENCOUNTER — Ambulatory Visit: Payer: BC Managed Care – PPO | Admitting: Psychiatry

## 2023-03-10 ENCOUNTER — Ambulatory Visit: Payer: BC Managed Care – PPO | Admitting: Psychiatry

## 2023-04-06 NOTE — Progress Notes (Signed)
BH MD/PA/NP OP Progress Note  04/15/2023 4:29 PM Becky Hatfield  MRN:  [id number]  Chief Complaint:  Chief Complaint  Patient presents with   Follow-up   HPI:  This is a follow-up appointment for anxiety.  She states that she has been doing well.  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  Although her daughter struggled with her mood as she expected a baby in October, she has been doing fine.  She enjoyed getting together with her father-in-law, and her daughter's family.  She used hydroxyzine 3 times since the last visit.  She experienced anxiety with chest tightness and participation.  1 of episodes of hard when she was stressed with progress report.  However, she was able to calm herself, and she denies any other anxiety.  Although she feels bad from time to time, missing her mother and her mother-in-law, she thinks she has been doing well.  She sleeps fair.  She denies feeling depressed.  She denies SI.  She denies alcohol use or drug use.  She states that it is difficult to get in to the appointment with her primary care, and she reports preference to continue to see this writer if needed.   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 Generalized anxiety disorder  F41.1       Past Psychiatric History: Please see initial evaluation for full details. I have reviewed the history. No updates at this time.     Past  Medical History:  Past Medical History:  Diagnosis Date   Allergy    seasonal   Kidney stones    x4   Renal disorder     Past Surgical History:  Procedure Laterality Date   BREAST BIOPSY Right 10/21/2016   FIBROCYSTIC CHANGES INCLUDING USUAL DUCTAL Hyperplasia, PASH, Fibroadenomatoid changes   TUBAL LIGATION      Family Psychiatric History: Please see initial evaluation for full details. I have reviewed the history. No updates at this time.     Family History:  Family History  Problem Relation Age of Onset   Hypertension Mother    Stroke Mother    Hypertension Father    Anuerysm Father    Breast cancer Neg Hx    Colon cancer Neg Hx    Colon polyps Neg Hx    Esophageal cancer Neg Hx    Stomach cancer Neg Hx    Rectal cancer Neg Hx     Social History:  Social History   Socioeconomic History   Marital status: Married    Spouse name: Not on file   Number of children: 3   Years of education: Not on file   Highest education level: Bachelor's degree (e.g., BA, AB, BS)  Occupational History   Not on file  Tobacco Use   Smoking status: Never   Smokeless tobacco: Never  Vaping Use   Vaping status: Never Used  Substance and Sexual Activity   Alcohol use: No    Alcohol/week: 0.0 standard drinks of alcohol   Drug use: No   Sexual activity: Yes    Partners: Male    Birth control/protection: Surgical  Other Topics Concern   Not on file  Social History Narrative   Not on file   Social Determinants of Health   Financial Resource Strain: Not on file  Food Insecurity: Not on file  Transportation Needs: Not on file  Physical Activity: Not on file  Stress: Not on file  Social Connections: Not on file    Allergies: No Known Allergies  Metabolic Disorder Labs: Lab Results  Component Value Date   HGBA1C 5.6 09/18/2020   No results found for: "PROLACTIN" Lab Results  Component Value Date   CHOL 246 (H) 09/18/2020   TRIG 45 09/18/2020   HDL 66 09/18/2020    CHOLHDL 3.7 09/18/2020   VLDL 15 07/31/2016   LDLCALC 173 (H) 09/18/2020   LDLCALC 140 (H) 09/08/2017   Lab Results  Component Value Date   TSH 1.530 01/07/2023   TSH 1.270 09/18/2020    Therapeutic Level Labs: No results found for: "LITHIUM" No results found for: "VALPROATE" No results found for: "CBMZ"  Current Medications: Current Outpatient Medications  Medication Sig Dispense Refill   clobetasol ointment (TEMOVATE) 0.05 % Apply to affected area every night for 4 weeks, then every other day for 4 weeks and then twice a week for 4 weeks or until resolution 30 g 4   hydrOXYzine (ATARAX) 10 MG tablet Take 1 tablet (10 mg total) by mouth 3 (three) times daily as needed for anxiety. 30 tablet 0   No current facility-administered medications for this visit.     Musculoskeletal: Strength & Muscle Tone: within normal limits Gait & Station: normal Patient leans: N/A  Psychiatric Specialty Exam: Review of Systems  Psychiatric/Behavioral:  Negative for agitation, behavioral problems, confusion, decreased concentration, dysphoric mood, hallucinations, self-injury, sleep disturbance and suicidal ideas. The patient is nervous/anxious. The patient is not hyperactive.   All other systems reviewed and are negative.   Blood pressure 124/78, pulse 77, temperature 98.1 F (36.7 C), temperature source Temporal, height 5\' 4"  (1.626 m), weight 148 lb (67.1 kg), last menstrual period 10/02/2015, SpO2 99%.Body mass index is 25.4 kg/m.  General Appearance: Fairly Groomed  Eye Contact:  Good  Speech:  Clear and Coherent  Volume:  Normal  Mood:   better  Affect:  Appropriate, Congruent, and Full Range  Thought Process:  Coherent  Orientation:  Full (Time, Place, and Person)  Thought Content: Logical   Suicidal Thoughts:  No  Homicidal Thoughts:  No  Memory:  Immediate;   Good  Judgement:  Good  Insight:  Good  Psychomotor Activity:  Normal  Concentration:  Concentration: Good and  Attention Span: Good  Recall:  Good  Fund of Knowledge: Good  Language: Good  Akathisia:  No  Handed:  Right  AIMS (if indicated): not done  Assets:  Communication Skills Desire for Improvement  ADL's:  Intact  Cognition: WNL  Sleep:  Good   Screenings: GAD-7    Flowsheet Row Office Visit from 01/06/2023 in [facility] Office Visit from 04/08/2019 in [facility] [place]  Total GAD-7 Score 3 1      PHQ2-9    Flowsheet Row Office Visit from 01/06/2023 in [facility] Total Score 0 0  PHQ-9 Total Score -- 0      Flowsheet Row ED from 11/28/2022 in [facility]  C-SSRS RISK CATEGORY No Risk        Assessment and Plan:  Becky Hatfield is a 57 y.o. year old female with a history of anxiety, who is referred for anxiety.   1. Generalized anxiety disorder Acute stressors include: loss of her mother in law in Jan 2024, mother in [place] 2024, her daughter's miscarriage in April 2024  Other stressors include:     History:suffering from anxiety for many years, not required pharmacological treatment    Although she continues to experience occasional intense anxiety, it has become much less as the last visit.  Will continue hydroxyzine as needed for anxiety.  She agrees to contact for sooner appointment if she were to need this medication more regularly/worsening in anxiety.  Discussed potential risk of fall, drowsiness.     Plan Continue hydroxyzine 10 mg daily as needed for anxiety (She will call if she needs a refill) Next appointment: 3/24 at 4:30, IP   The patient demonstrates the following risk factors for suicide: Chronic risk factors for suicide include: psychiatric disorder of anxiety . Acute risk factors for suicide include: loss (financial,  interpersonal, professional). Protective factors for this patient include: positive social support, coping skills, and hope for the future. Considering these factors, the overall suicide risk at this point appears to be low. Patient is appropriate for outpatient follow up.     Collaboration of Care: Collaboration of Care: Other reviewed notes in Epic  Patient/Guardian was advised Release of Information must be obtained prior to any record release in order to collaborate their care with an outside provider. Patient/Guardian was advised if they have not already done so to contact the registration department to sign all necessary forms in order for [place] to release information regarding their care.   Consent: Patient/Guardian gives verbal consent for treatment and assignment of benefits for services provided during this visit. Patient/Guardian expressed understanding and agreed to proceed.    Neysa Hotter, MD 04/15/2023, 4:29 PM

## 2023-04-15 ENCOUNTER — Encounter: Payer: Self-pay | Admitting: Psychiatry

## 2023-04-15 ENCOUNTER — Ambulatory Visit: Payer: BC Managed Care – PPO | Admitting: Psychiatry

## 2023-04-15 VITALS — BP 124/78 | HR 77 | Temp 98.1°F | Ht 64.0 in | Wt 148.0 lb

## 2023-04-15 DIAGNOSIS — F411 Generalized anxiety disorder: Secondary | ICD-10-CM | POA: Diagnosis not present

## 2023-04-15 NOTE — Patient Instructions (Signed)
Continue hydroxyzine 10 mg daily as needed for anxiety Next appointment: 3/24 at 4:30

## 2023-06-18 IMAGING — US US BREAST*R* LIMITED INC AXILLA
1 series · 10 of 10 positions shown · non-contrast
Comparison: Previous exam(s).

CLINICAL DATA: Callback for RIGHT breast mass

EXAM:
DIGITAL DIAGNOSTIC UNILATERAL RIGHT MAMMOGRAM WITH TOMOSYNTHESIS AND
CAD; ULTRASOUND RIGHT BREAST LIMITED
TECHNIQUE: Right digital diagnostic mammography and breast tomosynthesis was
performed. The images were evaluated with computer-aided detection.;
Targeted ultrasound examination of the right breast was performed

[Series 1: us breast*right* limited inc axilla · 0.04mm/px · 10 of 10 slices shown]
[im 1/10]
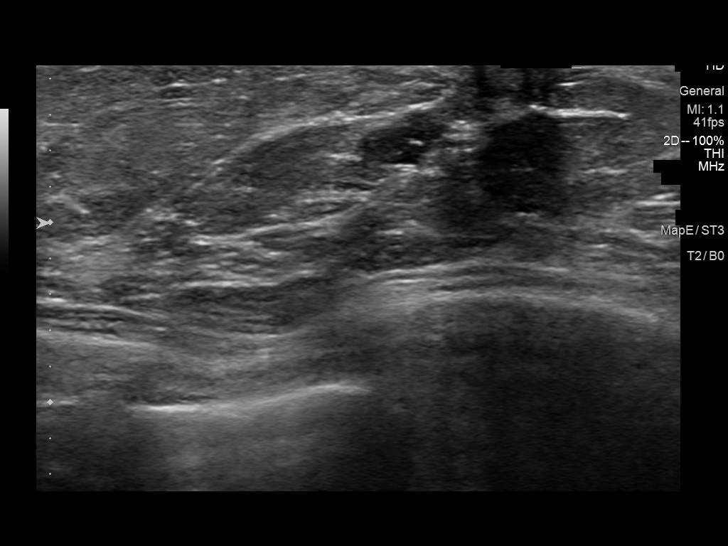
[im 2/10]
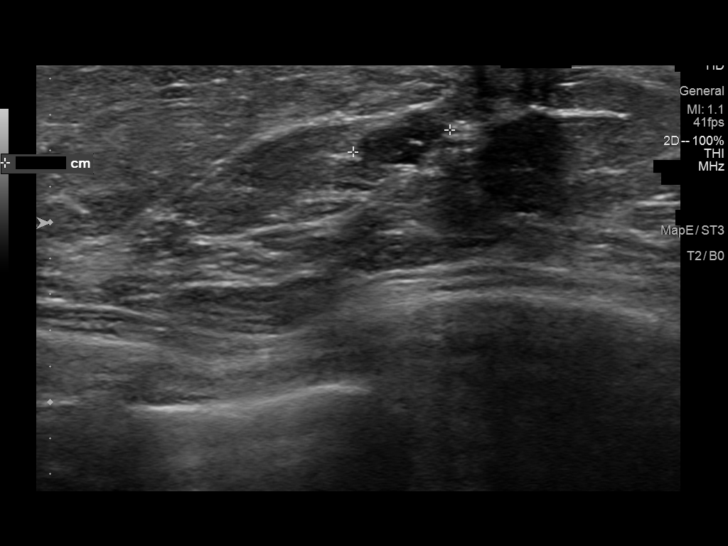
[im 3/10]
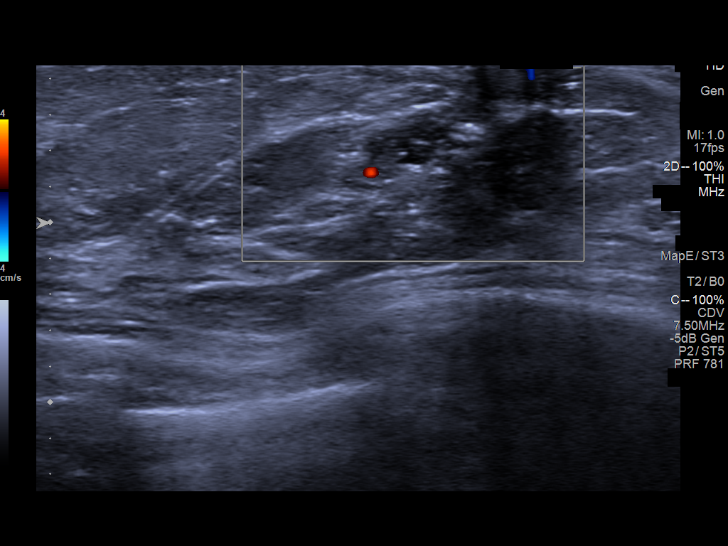
[im 4/10]
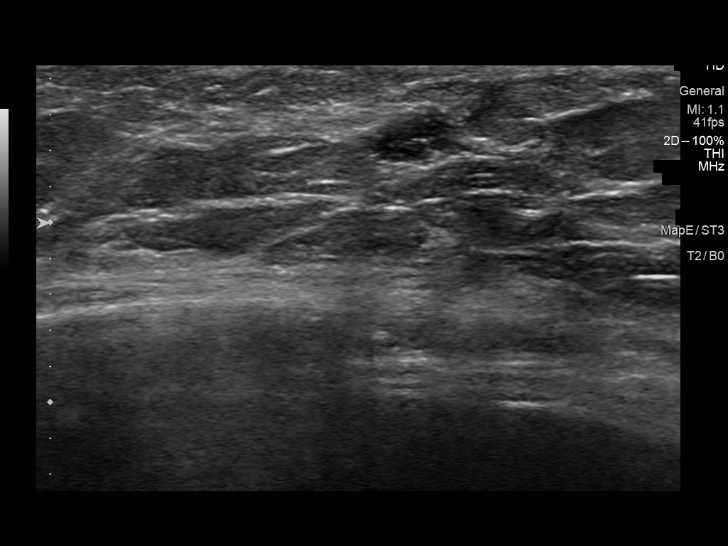
[im 5/10]
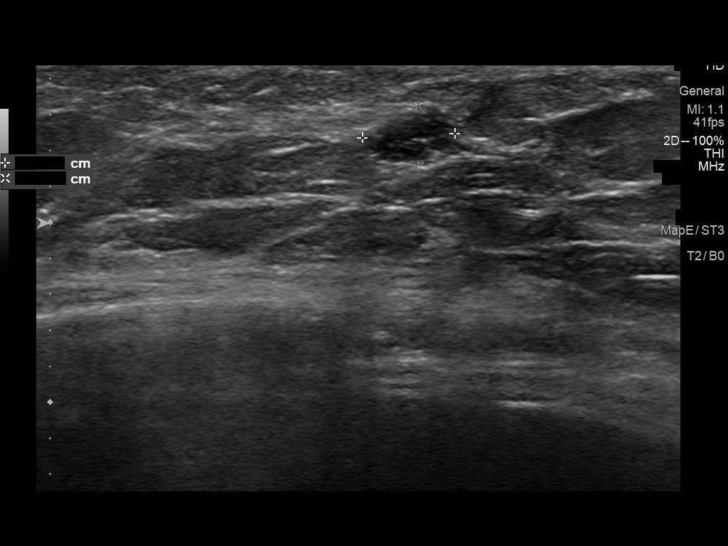
[im 6/10]
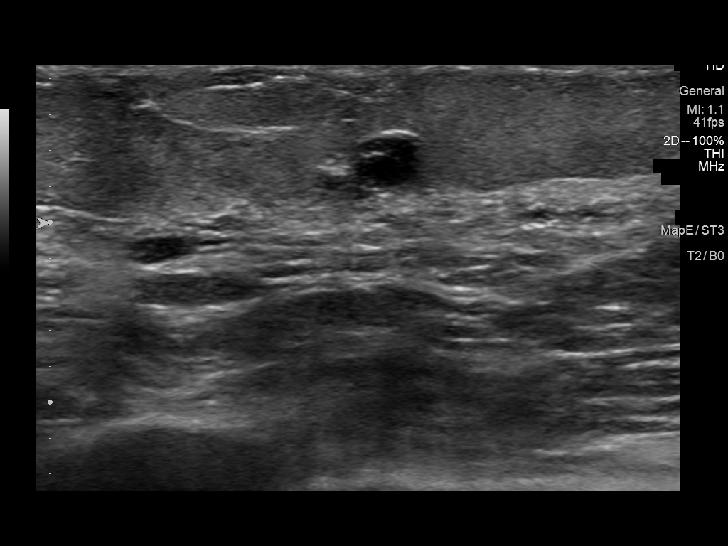
[im 7/10]
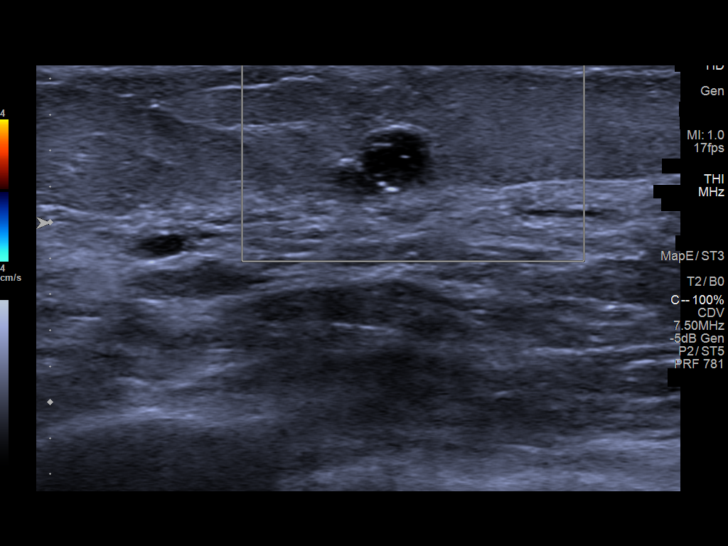
[im 8/10]
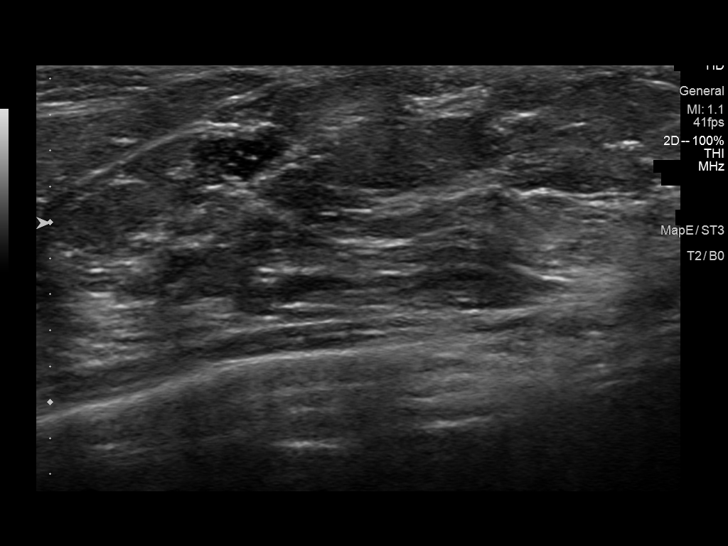
[im 9/10]
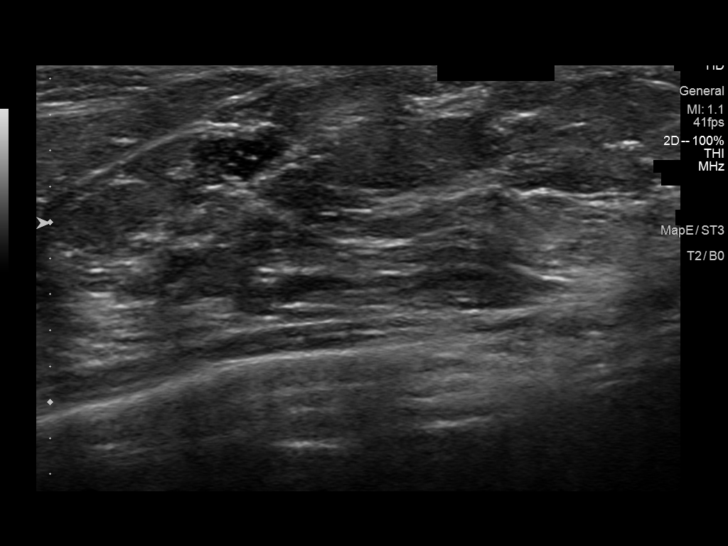
[im 10/10]
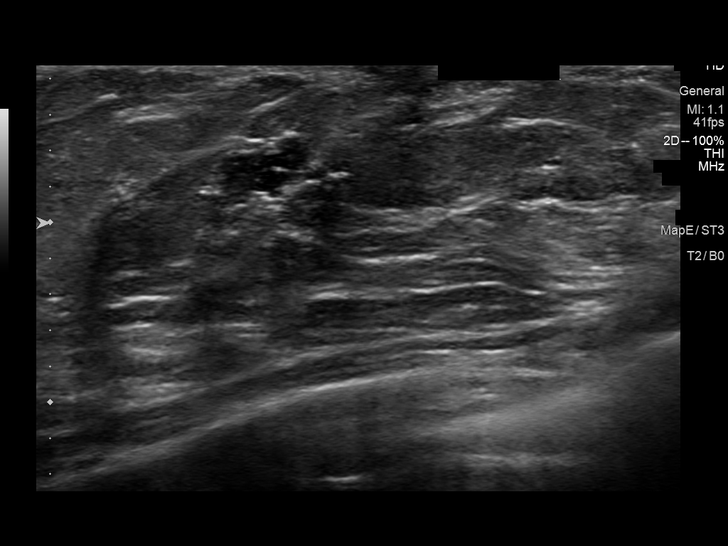

[10 of 10 positions shown; findings below may reference images not displayed]

ACR Breast Density Category c: The breast tissue is heterogeneously
dense, which may obscure small masses.
FINDINGS: Spot compression tomosynthesis views confirm persistence of an oval
circumscribed mass in the RIGHT lower breast at posterior depth.
Multiple additional oval circumscribed masses are noted
mammographically which have waxed and waned in comparison to prior
mammograms, most consistent with benign cysts.

On physical exam, no suspicious mass is appreciated. A supernumerary
nipple is noted.

Targeted ultrasound was performed of the RIGHT lower breast. At 6
o'clock 6 cm from the nipple, there is an oval circumscribed
anechoic mass with several thin internal septations. It measures 6 x
5 x 3 mm and is closely adjacent to the supernumerary nipple. This
likely reflects either a benign cluster of cysts or mild duct
ectasia given proximity to supernumerary nipple. This corresponds to
site of screening mammographic concern. An additional mildly
complicated cyst with a thin internal septation and internal mobile
debris is noted at 6 o'clock 2 cm from the nipple.
IMPRESSION: There is a benign cyst at the site of screening mammographic
concern.

RECOMMENDATION:
Screening mammogram in one year.(Code:[id number])

I have discussed the findings and recommendations with the patient.
If applicable, a reminder letter will be sent to the patient
regarding the next appointment.

BI-RADS CATEGORY  2: Benign.

## 2023-10-24 NOTE — Progress Notes (Signed)
 BH MD/PA/NP OP Progress Note  10/27/2023 5:11 PM Becky Hatfield  MRN:  [id number]  Chief Complaint:  Chief Complaint  Patient presents with   Follow-up   HPI:  This is a follow-up appointment for anxiety.  She states that she has been feeling anxious.  She feels some chest pain, and she does not like to feel this way.  Although her symptoms subsides when she takes hydroxyzine, it is difficult to take medication when she works as her eyes are heavy.  She had an anniversary of loss of her mother-in-law in January.  She states that her mother-in-law was mean, and it becomes worse especially later as she likely suffers from dementia.  She has any worsening of most of her mother in May.  Her daughter had miscarriage and this time.  However, she is expecting in June, and they had a baby shower yesterday.  Although there was some tension between her husband, who did not want to invite her sister to the shower due to how she was doing over selling their mother's house, she does not want any conflict.  She tries to stay away, and tries not to worry about things.  She continues to teach sixth grade.  She denies concern at work, except that she feels frustrated with how kids are doing.  She sleeps well.  She has good appetite.  She feels fatigued.  She feels worried about things.  She denies SI.  She agrees with the plan as outlined below.   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 Generalized anxiety disorder  F41.1       Past Psychiatric History: Please see initial evaluation for full details. I have reviewed the history. No updates at this time.     Past Medical  History:  Past Medical History:  Diagnosis Date   Allergy    seasonal   Kidney stones    x4   Renal disorder     Past Surgical History:  Procedure Laterality Date   BREAST BIOPSY Right 10/21/2016   FIBROCYSTIC CHANGES INCLUDING USUAL DUCTAL Hyperplasia, PASH, Fibroadenomatoid changes   TUBAL LIGATION      Family Psychiatric History: Please see initial evaluation for full details. I have reviewed the history. No updates at this time.     Family History:  Family History  Problem Relation Age of Onset   Hypertension Mother    Stroke Mother    Hypertension Father    Anuerysm Father    Breast cancer Neg Hx    Colon cancer Neg Hx    Colon polyps Neg Hx    Esophageal cancer Neg Hx    Stomach cancer Neg Hx    Rectal cancer Neg Hx     Social History:  Social History   Socioeconomic History   Marital status: Married    Spouse name: Not on file   Number of children: 3   Years of education: Not on file   Highest education level: Bachelor's degree (e.g., BA, AB, BS)  Occupational History   Not on file  Tobacco Use   Smoking status: Never   Smokeless tobacco: Never  Vaping  Use   Vaping status: Never Used  Substance and Sexual Activity   Alcohol use: No    Alcohol/week: 0.0 standard drinks of alcohol   Drug use: No   Sexual activity: Yes    Partners: Male    Birth control/protection: Surgical  Other Topics Concern   Not on file  Social History Narrative   Not on file   Social Drivers of Health   Financial Resource Strain: Not on file  Food Insecurity: Not on file  Transportation Needs: Not on file  Physical Activity: Not on file  Stress: Not on file  Social Connections: Not on file    Allergies: No Known Allergies  Metabolic Disorder Labs: Lab Results  Component Value Date   HGBA1C 5.6 09/18/2020   No results found for: "PROLACTIN" Lab Results  Component Value Date   CHOL 246 (H) 09/18/2020   TRIG 45 09/18/2020   HDL 66 09/18/2020   CHOLHDL 3.7  09/18/2020   VLDL 15 07/31/2016   LDLCALC 173 (H) 09/18/2020   LDLCALC 140 (H) 09/08/2017   Lab Results  Component Value Date   TSH 1.530 01/07/2023   TSH 1.270 09/18/2020    Therapeutic Level Labs: No results found for: "LITHIUM" No results found for: "VALPROATE" No results found for: "CBMZ"  Current Medications: Current Outpatient Medications  Medication Sig Dispense Refill   clobetasol ointment (TEMOVATE) 0.05 % Apply to affected area every night for 4 weeks, then every other day for 4 weeks and then twice a week for 4 weeks or until resolution 30 g 4   FLUoxetine (PROZAC) 10 MG capsule Take 1 capsule (10 mg total) by mouth daily. 30 capsule 1   hydrOXYzine (ATARAX) 10 MG tablet Take 1 tablet (10 mg total) by mouth 3 (three) times daily as needed for anxiety. 30 tablet 0   No current facility-administered medications for this visit.     Musculoskeletal: Strength & Muscle Tone: within normal limits Gait & Station: normal Patient leans: N/A  Psychiatric Specialty Exam: Review of Systems  Psychiatric/Behavioral:  Negative for agitation, behavioral problems, confusion, decreased concentration, dysphoric mood, hallucinations, self-injury, sleep disturbance and suicidal ideas. The patient is nervous/anxious. The patient is not hyperactive.   All other systems reviewed and are negative.   Blood pressure 137/83, pulse 76, temperature (!) 97.5 F (36.4 C), temperature source Skin, height 5\' 4"  (1.626 m), weight 146 lb (66.2 kg), last menstrual period 10/02/2015.Body mass index is 25.06 kg/m.  General Appearance: Well Groomed  Eye Contact:  Good  Speech:  Clear and Coherent  Volume:  Normal  Mood:  Anxious  Affect:  Appropriate, Congruent, and slightly tense at times  Thought Process:  Coherent  Orientation:  Full (Time, Place, and Person)  Thought Content: Logical   Suicidal Thoughts:  No  Homicidal Thoughts:  No  Memory:  Immediate;   Good  Judgement:  Good  Insight:   Good  Psychomotor Activity:  Normal  Concentration:  Concentration: Good and Attention Span: Good  Recall:  Good  Fund of Knowledge: Good  Language: Good  Akathisia:  No  Handed:  Right  AIMS (if indicated): not done  Assets:  Communication Skills Desire for Improvement  ADL's:  Intact  Cognition: WNL  Sleep:  Good   Screenings: GAD-7    Flowsheet Row Office Visit from 01/06/2023 in [facility] Office Visit from 04/08/2019 in [facility]  Total GAD-7 Score 3 1  PHQ2-9    Flowsheet Row Office Visit from 01/06/2023 in [facility] Office Visit from 09/08/2017 in [facility] for [facility] Total Score 0 0  PHQ-9 Total Score -- 0      Flowsheet Row ED from 11/28/2022 in [facility]  C-SSRS RISK CATEGORY No Risk        Assessment and Plan:  DANYELA POSAS is a 58 y.o. year old female with a history of anxiety, who is referred for anxiety.   1. Generalized anxiety disorder Acute stressors include: loss of her mother in law in Jan 2024, mother in [place] 2024, her daughter's miscarriage in April 2024  Other stressors include:     History:suffering from anxiety for many years, not required pharmacological treatment      She reports significant worsening in anxiety since the last visit.  Will start fluoxetine to target anxiety. This medication was selected based on her preference for one with fewer drowsiness-related side effects.  Will continue hydroxyzine as needed for anxiety.    Plan Start fluoxetine 10 mg daily  Continue hydroxyzine 10 mg daily as needed for anxiety (She will call if she needs a refill) Next appointment: 5/1 at 4 PM, IP   The patient demonstrates the following risk factors for suicide: Chronic risk factors for suicide include: psychiatric disorder of anxiety . Acute risk factors  for suicide include: loss (financial, interpersonal, professional). Protective factors for this patient include: positive social support, coping skills, and hope for the future. Considering these factors, the overall suicide risk at this point appears to be low. Patient is appropriate for outpatient follow up.       Collaboration of Care: Collaboration of Care: Other reviewed notes in Epic  Patient/Guardian was advised Release of Information must be obtained prior to any record release in order to collaborate their care with an outside provider. Patient/Guardian was advised if they have not already done so to contact the registration department to sign all necessary forms in order for [place] to release information regarding their care.   Consent: Patient/Guardian gives verbal consent for treatment and assignment of benefits for services provided during this visit. Patient/Guardian expressed understanding and agreed to proceed.    Neysa Hotter, MD 10/27/2023, 5:11 PM

## 2023-10-27 ENCOUNTER — Other Ambulatory Visit: Payer: Self-pay

## 2023-10-27 ENCOUNTER — Ambulatory Visit (INDEPENDENT_AMBULATORY_CARE_PROVIDER_SITE_OTHER): Payer: BC Managed Care – PPO | Admitting: Psychiatry

## 2023-10-27 ENCOUNTER — Encounter: Payer: Self-pay | Admitting: Psychiatry

## 2023-10-27 VITALS — BP 137/83 | HR 76 | Temp 97.5°F | Ht 64.0 in | Wt 146.0 lb

## 2023-10-27 DIAGNOSIS — F411 Generalized anxiety disorder: Secondary | ICD-10-CM | POA: Diagnosis not present

## 2023-10-27 MED ORDER — FLUOXETINE HCL 10 MG PO CAPS: 10.0000 mg | ORAL_CAPSULE | Freq: Every day | ORAL | 1 refills | Status: DC

## 2023-10-27 NOTE — Patient Instructions (Signed)
  Start fluoxetine 10 mg daily  Continue hydroxyzine 10 mg daily as needed for anxiety  Next appointment: 5/1 at 4 PM

## 2023-11-19 ENCOUNTER — Other Ambulatory Visit: Payer: Self-pay | Admitting: Psychiatry

## 2023-12-08 ENCOUNTER — Ambulatory Visit: Admitting: Psychiatry

## 2023-12-18 NOTE — Progress Notes (Signed)
 BH MD/PA/NP OP Progress Note  12/23/2023 4:09 PM RUHEE ENCK  MRN:  [id number]  Chief Complaint:  Chief Complaint  Patient presents with   Follow-up   HPI:  This is a follow-up appointment for anxiety.  She states that she has her first granddaughter 2 weeks ago.  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  Her daughter has been doing well as well.  Her daughter's twin sister have been visiting them from [place] .  She has been doing well at school.  She denies any irritability.  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  She will be finishing up school, and is looking forward to have more time with her family.  She enjoys fishing, going to the gym with her husband.  Although there was a few times before having palpitation in the morning with anxiety, she was able to redirect herself.  She denies insomnia.  She has good appetite.  She denies feeling depressed.  She denies SI.  She feels comfortable to stay on the current medication regimen.   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 Generalized anxiety disorder  F41.1       Past Psychiatric History: Please see initial evaluation for full details. I have reviewed the history. No updates at this time.     Past Medical History:  Past Medical History:  Diagnosis Date   Allergy    seasonal   Kidney stones    x4   Renal disorder     Past Surgical History:  Procedure Laterality Date   BREAST BIOPSY Right 10/21/2016   FIBROCYSTIC CHANGES INCLUDING USUAL DUCTAL  Hyperplasia, PASH, Fibroadenomatoid changes   TUBAL LIGATION      Family Psychiatric History: Please see initial evaluation for full details. I have reviewed the history. No updates at this time.     Family History:  Family History  Problem Relation Age of Onset   Hypertension Mother    Stroke Mother    Hypertension Father    Anuerysm Father    Breast cancer Neg Hx    Colon cancer Neg Hx    Colon polyps Neg Hx    Esophageal cancer Neg Hx    Stomach cancer Neg Hx    Rectal cancer Neg Hx     Social History:  Social History   Socioeconomic History   Marital status: Married    Spouse name: Not on file   Number of children: 3   Years of education: Not on file   Highest education level: Bachelor's degree (e.g., BA, AB, BS)  Occupational History   Not on file  Tobacco Use   Smoking status: Never   Smokeless tobacco: Never  Vaping Use   Vaping status: Never Used  Substance and Sexual Activity   Alcohol use: No    Alcohol/week: 0.0 standard drinks of alcohol   Drug use: No   Sexual activity: Yes    Partners: Male    Birth  control/protection: Surgical  Other Topics Concern   Not on file  Social History Narrative   Not on file   Social Drivers of Health   Financial Resource Strain: Not on file  Food Insecurity: Not on file  Transportation Needs: Not on file  Physical Activity: Not on file  Stress: Not on file  Social Connections: Not on file    Allergies: No Known Allergies  Metabolic Disorder Labs: Lab Results  Component Value Date   HGBA1C 5.6 09/18/2020   No results found for: "PROLACTIN" Lab Results  Component Value Date   CHOL 246 (H) 09/18/2020   TRIG 45 09/18/2020   HDL 66 09/18/2020   CHOLHDL 3.7 09/18/2020   VLDL 15 07/31/2016   LDLCALC 173 (H) 09/18/2020   LDLCALC 140 (H) 09/08/2017   Lab Results  Component Value Date   TSH 1.530 01/07/2023   TSH 1.270 09/18/2020    Therapeutic Level Labs: No results found for: "LITHIUM" No results  found for: "VALPROATE" No results found for: "CBMZ"  Current Medications: Current Outpatient Medications  Medication Sig Dispense Refill   clobetasol  ointment (TEMOVATE ) 0.05 % Apply to affected area every night for 4 weeks, then every other day for 4 weeks and then twice a week for 4 weeks or until resolution 30 g 4   FLUoxetine  (PROZAC ) 10 MG capsule Take 1 capsule (10 mg total) by mouth daily. 90 capsule 0   hydrOXYzine  (ATARAX ) 10 MG tablet Take 1 tablet (10 mg total) by mouth 3 (three) times daily as needed for anxiety. 30 tablet 0   No current facility-administered medications for this visit.     Musculoskeletal: Strength & Muscle Tone: within normal limits Gait & Station: normal Patient leans: N/A  Psychiatric Specialty Exam: Review of Systems  Blood pressure 124/87, pulse 79, temperature (!) 96.6 F (35.9 C), temperature source Temporal, height 5\' 4"  (1.626 m), weight 143 lb 12.8 oz (65.2 kg), last menstrual period 10/02/2015.Body mass index is 24.68 kg/m.  General Appearance: Well Groomed  Eye Contact:  Good  Speech:  Clear and Coherent  Volume:  Normal  Mood:  good  Affect:  Appropriate, Congruent, and Full Range  Thought Process:  Coherent  Orientation:  Full (Time, Place, and Person)  Thought Content: Logical   Suicidal Thoughts:  No  Homicidal Thoughts:  No  Memory:  Immediate;   Good  Judgement:  Good  Insight:  Good  Psychomotor Activity:  Normal  Concentration:  Concentration: Good and Attention Span: Good  Recall:  Good  Fund of Knowledge: Good  Language: Good  Akathisia:  No  Handed:  Right  AIMS (if indicated): not done  Assets:  Communication Skills Desire for Improvement  ADL's:  Intact  Cognition: WNL  Sleep:  Good   Screenings: GAD-7    Flowsheet Row Office Visit from 01/06/2023 in [facility] Office Visit from 04/08/2019 in [facility] [place]  Total GAD-7 Score 3 1       PHQ2-9    Flowsheet Row Office Visit from 01/06/2023 in [facility] Visit from 09/08/[facility] in [facility] Total Score 0 0  PHQ-9 Total Score -- 0      Flowsheet Row ED from 11/28/2022 in [facility]  C-SSRS RISK CATEGORY No Risk        Assessment and Plan:  JANELI LEWISON is a 58  y.o. year old female with a history of anxiety, who is referred for anxiety.   1. Generalized anxiety disorder She has faced multiple recent stressors, including the loss of her mother-in-law in January 2024, who suffered from dementia and was emotionally difficult. She also lost her own mother in May. Her daughter in [place]  experienced a miscarriage, and she continues to have conflict with her sister. History:suffering from anxiety for many years that she feels unable to control, not required pharmacological treatment       There has been significant improvement in anxiety since starting fluoxetine .  Will continue current dose to target anxiety along with hydroxyzine  as needed for anxiety.   Plan Continue fluoxetine  10 mg daily  Continue hydroxyzine  10 mg daily as needed for anxiety (She will call if she needs a refill) Next appointment: 7/16 at 1:40, video  The patient demonstrates the following risk factors for suicide: Chronic risk factors for suicide include: psychiatric disorder of anxiety . Acute risk factors for suicide include: loss (financial, interpersonal, professional). Protective factors for this patient include: positive social support, coping skills, and hope for the future. Considering these factors, the overall suicide risk at this point appears to be low. Patient is appropriate for outpatient follow up.     Collaboration of Care: Collaboration of Care: Other reviewed notes in Epic  Patient/Guardian was advised Release of Information must be obtained prior to any  record release in order to collaborate their care with an outside provider. Patient/Guardian was advised if they have not already done so to contact the registration department to sign all necessary forms in order for us  to release information regarding their care.   Consent: Patient/Guardian gives verbal consent for treatment and assignment of benefits for services provided during this visit. Patient/Guardian expressed understanding and agreed to proceed.    Todd Fossa, MD 12/23/2023, 4:09 PM

## 2023-12-23 ENCOUNTER — Other Ambulatory Visit: Payer: Self-pay

## 2023-12-23 ENCOUNTER — Encounter: Payer: Self-pay | Admitting: Psychiatry

## 2023-12-23 ENCOUNTER — Ambulatory Visit (INDEPENDENT_AMBULATORY_CARE_PROVIDER_SITE_OTHER): Admitting: Psychiatry

## 2023-12-23 VITALS — BP 124/87 | HR 79 | Temp 96.6°F | Ht 64.0 in | Wt 143.8 lb

## 2023-12-23 DIAGNOSIS — F411 Generalized anxiety disorder: Secondary | ICD-10-CM | POA: Diagnosis not present

## 2023-12-23 NOTE — Patient Instructions (Addendum)
 Continue fluoxetine  10 mg daily  Continue hydroxyzine  10 mg daily as needed for anxiety  Next appointment: 7/16 at 1:40

## 2024-01-01 ENCOUNTER — Encounter: Payer: Self-pay | Admitting: Family Medicine

## 2024-01-26 ENCOUNTER — Other Ambulatory Visit (HOSPITAL_COMMUNITY)
Admission: RE | Admit: 2024-01-26 | Discharge: 2024-01-26 | Disposition: A | Discharge status: Home / Self Care | Source: Ambulatory Visit | Attending: Obstetrics and Gynecology | Admitting: Obstetrics and Gynecology

## 2024-01-26 ENCOUNTER — Encounter: Payer: Self-pay | Admitting: Obstetrics and Gynecology

## 2024-01-26 ENCOUNTER — Ambulatory Visit (INDEPENDENT_AMBULATORY_CARE_PROVIDER_SITE_OTHER): Admitting: Obstetrics and Gynecology

## 2024-01-26 VITALS — BP 130/84 | HR 85 | Ht 64.0 in | Wt 141.1 lb

## 2024-01-26 DIAGNOSIS — Z01419 Encounter for gynecological examination (general) (routine) without abnormal findings: Secondary | ICD-10-CM

## 2024-01-26 MED ORDER — CLOBETASOL PROPIONATE 0.05 % EX OINT: TOPICAL_OINTMENT | CUTANEOUS | 4 refills | Status: AC

## 2024-01-26 NOTE — Progress Notes (Signed)
 Subjective:     Becky Hatfield is a 58 y.o. female P3 postmenopausal with BMI 24 who is here for a comprehensive physical exam. The patient reports no problems. She is sexually active without complaints. She denies any episodes of postmenopausal vaginal bleeding. She denies any urinary incontinence, pelvic pain or abnormal discharge. Patient reports a normal colonoscopy. Patient admits to major flares of her lichen sclerosis and needs refill on her cream  Past Medical History:  Diagnosis Date   Allergy    seasonal   Kidney stones    x4   Renal disorder    Past Surgical History:  Procedure Laterality Date   BREAST BIOPSY Right 10/21/2016   FIBROCYSTIC CHANGES INCLUDING USUAL DUCTAL Hyperplasia, PASH, Fibroadenomatoid changes   TUBAL LIGATION     Family History  Problem Relation Age of Onset   Hypertension Mother    Stroke Mother    Hypertension Father    Anuerysm Father    Breast cancer Neg Hx    Colon cancer Neg Hx    Colon polyps Neg Hx    Esophageal cancer Neg Hx    Stomach cancer Neg Hx    Rectal cancer Neg Hx     Social History   Socioeconomic History   Marital status: Married    Spouse name: Not on file   Number of children: 3   Years of education: Not on file   Highest education level: Bachelor's degree (e.g., BA, AB, BS)  Occupational History   Not on file  Tobacco Use   Smoking status: Never   Smokeless tobacco: Never  Vaping Use   Vaping status: Never Used  Substance and Sexual Activity   Alcohol use: No    Alcohol/week: 0.0 standard drinks of alcohol   Drug use: No   Sexual activity: Yes    Partners: Male    Birth control/protection: Surgical  Other Topics Concern   Not on file  Social History Narrative   Not on file   Social Drivers of Health   Financial Resource Strain: Not on file  Food Insecurity: Not on file  Transportation Needs: Not on file  Physical Activity: Not on file  Stress: Not on file  Social Connections: Not on file   Intimate Partner Violence: Not on file   Health Maintenance  Topic Date Due   HIV Screening  Never done   Hepatitis C Screening  Never done   DTaP/Tdap/Td (1 - Tdap) Never done   Zoster Vaccines- Shingrix (1 of 2) Never done   COVID-19 Vaccine (1 - 2024-25 season) Never done   INFLUENZA VACCINE  03/05/2024   MAMMOGRAM  01/14/2025   Cervical Cancer Screening (HPV/Pap Cotest)  09/18/2025   Colonoscopy  03/03/2031   HPV VACCINES  Aged Out   Meningococcal B Vaccine  Aged Out       Review of Systems Pertinent items noted in HPI and remainder of comprehensive ROS otherwise negative.   Objective:    GENERAL: Well-developed, well-nourished female in no acute distress.  HEENT: Normocephalic, atraumatic. Sclerae anicteric.  NECK: Supple. Normal thyroid.  LUNGS: Clear to auscultation bilaterally.  HEART: Regular rate and rhythm. BREASTS: Symmetric in size. No palpable masses or lymphadenopathy, skin changes, or nipple drainage. ABDOMEN: Soft, nontender, nondistended. No organomegaly. PELVIC: Normal external female genitalia. Vagina is pale and atrophic. Complete resolution of lichen patch on anterior aspect of introitus. Small patch located at 6 o'clock.  Normal discharge. Normal appearing cervix. Uterus is normal in size. No adnexal  mass or tenderness. Chaperone present during the pelvic exam EXTREMITIES: No cyanosis, clubbing, or edema, 2+ distal pulses.     Assessment:    Healthy female exam.      Plan:    Pap smear collected Screening mammogram ordered Health maintenance labs ordered and patient will return fasting to our [facility] office Patient current on colonoscopy Patient will be contacted with abnormal results See After Visit Summary for Counseling Recommendations

## 2024-01-26 NOTE — Progress Notes (Signed)
 Would like to discuss the clobetasol  ointment with provider.  Pt declined STD testing.  Request mammogram referral.   No other concerns at this time.

## 2024-01-26 NOTE — Progress Notes (Signed)
  History:  [name] is a 57 y.o. [phone] postmenopausal patient who presents to clinic today for an annual exam.   On interview, she reports feeling well. She denies any vaginal bleeding, discharge, pain, or cramping. She endorses a history of lichen sclerosus well controlled with clobetasol . She reports being sexually active with her husband and endorses mild pain with sexual intercourse, but denies any other concerns regarding her sexual wellness. She denies any urinary concerns. She reports undergoing a colonoscopy within the last two years. She reports no other concerns today.  The following portions of the patient's history were reviewed and updated as appropriate: allergies, current medications, family history, past medical history, social history, past surgical history and problem list.  Review of Systems:  ROS    Objective:  Physical Exam BP 130/84   Pulse 85   Ht 5' 4 (1.626 m)   Wt 141 lb 1.6 oz (64 kg)   LMP 10/02/2015 Comment: tubal ligation  BMI 24.22 kg/m  Physical Exam Constitutional:      Appearance: Normal appearance.   Eyes:     Conjunctiva/sclera: Conjunctivae normal.   Pulmonary:     Effort: Pulmonary effort is normal.   Musculoskeletal:        General: Normal range of motion.     Cervical back: Normal range of motion.   Neurological:     Mental Status: She is alert and oriented to person, place, and time.     Labs and Imaging No results found for this or any previous visit (from the past 24 hours).  No results found.  Health Maintenance Due  Topic Date Due   HIV Screening  Never done   Hepatitis C Screening  Never done   DTaP/Tdap/Td (1 - Tdap) Never done   Zoster Vaccines- Shingrix (1 of 2) Never done   COVID-19 Vaccine (1 - 2024-25 season) Never done    Labs, imaging and previous visits in Epic and Care Everywhere reviewed  Assessment & Plan:  1. Encounter for gynecological examination without abnormal finding (Primary)  -  Cytology - PAP( [place]) - MM 3D SCREENING MAMMOGRAM BILATERAL BREAST; Future - Patient interested in maintenance labs such as CBC, CMP, etc.  - Patient scheduled [facility]  2. Lichen Sclerosus  - Appears to be well controlled with clobetasol  cream - Clobetasol  cream refills sent to patient's pharmacy  - Patient advised to reach out to office if additional refills are needed   Approximately 15 minutes of total time was spent with this patient on 01/26/24.  Return in about 1 year (around 01/25/2025).  Becky Hatfield, Medical Student 01/26/2024 4:04 PM

## 2024-01-27 ENCOUNTER — Other Ambulatory Visit

## 2024-01-27 DIAGNOSIS — Z01419 Encounter for gynecological examination (general) (routine) without abnormal findings: Secondary | ICD-10-CM

## 2024-01-28 ENCOUNTER — Ambulatory Visit: Payer: Self-pay | Admitting: Obstetrics and Gynecology

## 2024-01-28 LAB — COMPREHENSIVE METABOLIC PANEL WITH GFR
ALT: 18 IU/L (ref 0–32)
AST: 21 IU/L (ref 0–40)
Albumin: 4.3 g/dL (ref 3.8–4.9)
Alkaline Phosphatase: 44 IU/L (ref 44–121)
BUN/Creatinine Ratio: 19 (ref 9–23)
BUN: 15 mg/dL (ref 6–24)
Bilirubin Total: 0.5 mg/dL (ref 0.0–1.2)
CO2: 19 mmol/L — ABNORMAL LOW (ref 20–29)
Calcium: 9.4 mg/dL (ref 8.7–10.2)
Chloride: 106 mmol/L (ref 96–106)
Creatinine, Ser: 0.78 mg/dL (ref 0.57–1.00)
Globulin, Total: 2.1 g/dL (ref 1.5–4.5)
Glucose: 94 mg/dL (ref 70–99)
Potassium: 4.4 mmol/L (ref 3.5–5.2)
Sodium: 142 mmol/L (ref 134–144)
Total Protein: 6.4 g/dL (ref 6.0–8.5)
eGFR: 88 mL/min/{1.73_m2} (ref >59)

## 2024-01-28 LAB — HEMOGLOBIN A1C
Est. average glucose Bld gHb Est-mCnc: 117 mg/dL
Hgb A1c MFr Bld: 5.7 % — ABNORMAL HIGH (ref 4.8–5.6)

## 2024-01-28 LAB — LIPID PANEL
Chol/HDL Ratio: 3.7 ratio (ref 0.0–4.4)
Cholesterol, Total: 233 mg/dL — ABNORMAL HIGH (ref 100–199)
HDL: 63 mg/dL (ref >39)
LDL Chol Calc (NIH): 162 mg/dL — ABNORMAL HIGH (ref 0–99)
Triglycerides: 51 mg/dL (ref 0–149)
VLDL Cholesterol Cal: 8 mg/dL (ref 5–40)

## 2024-01-28 LAB — CBC
Hematocrit: 43.4 % (ref 34.0–46.6)
Hemoglobin: 14.1 g/dL (ref 11.1–15.9)
MCH: 30.8 pg (ref 26.6–33.0)
MCHC: 32.5 g/dL (ref 31.5–35.7)
MCV: 95 fL (ref 79–97)
Platelets: 224 10*3/uL (ref 150–450)
RBC: 4.58 x10E6/uL (ref 3.77–5.28)
RDW: 12.1 % (ref 11.7–15.4)
WBC: 3.6 10*3/uL (ref 3.4–10.8)

## 2024-01-28 LAB — CYTOLOGY - PAP
Comment: NEGATIVE
Diagnosis: NEGATIVE
High risk HPV: NEGATIVE

## 2024-01-28 LAB — TSH: TSH: 1.33 u[IU]/mL (ref 0.450–4.500)

## 2024-02-14 ENCOUNTER — Other Ambulatory Visit: Payer: Self-pay | Admitting: Psychiatry

## 2024-02-14 NOTE — Progress Notes (Unsigned)
 Virtual Visit via Video Note  I connected with Becky Hatfield on 02/18/24 at  1:40 PM EDT by a video enabled telemedicine application and verified that I am speaking with the correct person using two identifiers.  Location: Patient: home Provider: home office Persons participated in the visit- patient, provider    I discussed the limitations of evaluation and management by telemedicine and the availability of in person appointments. The patient expressed understanding and agreed to proceed.    I discussed the assessment and treatment plan with the patient. The patient was provided an opportunity to ask questions and all were answered. The patient agreed with the plan and demonstrated an understanding of the instructions.   The patient was advised to call back or seek an in-person evaluation if the symptoms worsen or if the condition fails to improve as anticipated.    Becky Sleet, MD    [facility] MD/PA/NP OP Progress Note  02/18/2024 2:07 PM Becky Hatfield  MRN:  [id number]  Chief Complaint:  Chief Complaint  Patient presents with   Follow-up   HPI:  This is a follow-up appointment for anxiety.  She states that she has been doing well.  She will return to school on August 1.  She does not wake up with palpitation anymore.  She has been trying to get out from her mind.  She hardly ever feels anxious.  Her granddaughter, who is 96 weeks old has been doing well since getting out from NICU.  They live in the neighborhood, and she enjoys seeing her.  She sleeps up to 8 hours.  She reports having difficulty in orgasm.  However, she is afraid of making changing the medication as this has been working very well.  She has not taken any hydroxyzine  since the last visit.  She denies feeling depressed.  She denies SI, HI, hallucinations.  She denies alcohol use or substance use.  She agrees with the plans as outlined below.    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 Generalized anxiety disorder  F41.1       Past Psychiatric History: Please see initial evaluation for full details. I have reviewed the history. No updates at this time.     Past Medical History:  Past Medical History:  Diagnosis Date   Allergy    seasonal   Kidney stones    x4   Renal disorder     Past Surgical History:  Procedure Laterality Date   BREAST BIOPSY Right 10/21/2016   FIBROCYSTIC CHANGES INCLUDING USUAL DUCTAL Hyperplasia, PASH, Fibroadenomatoid changes   TUBAL LIGATION      Family Psychiatric History: Please see initial evaluation for full details. I have reviewed the history. No updates at this time.     Family History:  Family History  Problem Relation Age of Onset   Hypertension Mother    Stroke Mother    Hypertension Father    Anuerysm Father    Breast cancer Neg Hx    Colon cancer Neg Hx    Colon polyps Neg Hx    Esophageal cancer Neg Hx    Stomach cancer Neg Hx  Rectal cancer Neg Hx     Social History:  Social History   Socioeconomic History   Marital status: Married    Spouse name: Not on file   Number of children: 3   Years of education: Not on file   Highest education level: Bachelor's degree (e.g., BA, AB, BS)  Occupational History   Not on file  Tobacco Use   Smoking status: Never   Smokeless tobacco: Never  Vaping Use   Vaping status: Never Used  Substance and Sexual Activity   Alcohol use: No    Alcohol/week: 0.0 standard drinks of alcohol   Drug use: No   Sexual activity: Yes    Partners: Male    Birth control/protection: Surgical  Other Topics Concern   Not on file  Social History Narrative   Not on file   Social  Drivers of Health   Financial Resource Strain: Not on file  Food Insecurity: Not on file  Transportation Needs: Not on file  Physical Activity: Not on file  Stress: Not on file  Social Connections: Not on file    Allergies: No Known Allergies  Metabolic Disorder Labs: Lab Results  Component Value Date   HGBA1C 5.7 (H) 01/27/2024   No results found for: PROLACTIN Lab Results  Component Value Date   CHOL 233 (H) 01/27/2024   TRIG 51 01/27/2024   HDL 63 01/27/2024   CHOLHDL 3.7 01/27/2024   VLDL 15 07/31/2016   LDLCALC 162 (H) 01/27/2024   LDLCALC 173 (H) 09/18/2020   Lab Results  Component Value Date   TSH 1.330 01/27/2024   TSH 1.530 01/07/2023    Therapeutic Level Labs: No results found for: LITHIUM No results found for: VALPROATE No results found for: CBMZ  Current Medications: Current Outpatient Medications  Medication Sig Dispense Refill   clobetasol  ointment (TEMOVATE ) 0.05 % Apply to affected area every night for 4 weeks, then every other day for 4 weeks and then twice a week for 4 weeks or until resolution 30 g 4   [START ON 02/24/2024] FLUoxetine  (PROZAC ) 10 MG capsule Take 1 capsule (10 mg total) by mouth daily. 90 capsule 0   hydrOXYzine  (ATARAX ) 10 MG tablet Take 1 tablet (10 mg total) by mouth 3 (three) times daily as needed for anxiety. 30 tablet 0   No current facility-administered medications for this visit.     Musculoskeletal: Strength & Muscle Tone: N/A Gait & Station: N/A Patient leans: N/A  Psychiatric Specialty Exam: Review of Systems  Psychiatric/Behavioral: Negative.    All other systems reviewed and are negative.   Last menstrual period 10/02/2015.There is no height or weight on file to calculate BMI.  General Appearance: Well Groomed  Eye Contact:  Good  Speech:  Clear and Coherent  Volume:  Normal  Mood:  good  Affect:  Appropriate, Congruent, and Full Range  Thought Process:  Coherent  Orientation:  Full (Time,  Place, and Person)  Thought Content: Logical   Suicidal Thoughts:  No  Homicidal Thoughts:  No  Memory:  Immediate;   Good  Judgement:  Good  Insight:  Good  Psychomotor Activity:  Normal  Concentration:  Concentration: Good and Attention Span: Good  Recall:  Good  Fund of Knowledge: Good  Language: Good  Akathisia:  No  Handed:  Right  AIMS (if indicated): not done  Assets:  Communication Skills Desire for Improvement  ADL's:  Intact  Cognition: WNL  Sleep:  Good   Screenings: GAD-7  Flowsheet Row Office Visit from 01/26/2024 in [facility] for [employer] at [place] Office Visit from 01/06/2023 in [facility] Office Visit from 04/08/2019 in [facility] for [facility] at [place]  Total GAD-7 Score 0 3 1   PHQ2-9    Flowsheet Row Office Visit from 01/26/2024 in [facility] for [facility] at [place] Office Visit from 01/06/2023 in [facility] Office Visit from 09/08/2017 in [facility] for [facility] at [facility] Total Score 0 0 0  PHQ-9 Total Score 0 -- 0   Flowsheet Row ED from 11/28/2022 in [facility]  C-SSRS RISK CATEGORY No Risk     Assessment and Plan:  Becky Hatfield is a 58 y.o. year old female with a history of anxiety, who is referred for anxiety.   1. Generalized anxiety disorder She has faced multiple recent stressors, including the loss of her mother-in-law in January 2024, who suffered from dementia and was emotionally difficult. She also lost her own mother in May. Her daughter in [place]  experienced a miscarriage, and she continues to have conflict with her sister. History:suffering from anxiety for many years that she feels unable to control, not required pharmacological treatment       There has been steady improvement in anxiety since starting fluoxetine .  Will continue current dose to target  anxiety along with hydroxyzine  as needed for anxiety.  Noted that she reports sexual side effect from this medication.  Although she wants to stay on fluoxetine , she acknowledges the options of trying other antidepressant to mitigate the side effect.    Plan Continue fluoxetine  10 mg daily - monitor sexual side effect Continue hydroxyzine  10 mg daily as needed for anxiety (She will call if she needs a refill) Next appointment: 7/16 at 1:40, video   The patient demonstrates the following risk factors for suicide: Chronic risk factors for suicide include: psychiatric disorder of anxiety . Acute risk factors for suicide include: loss (financial, interpersonal, professional). Protective factors for this patient include: positive social support, coping skills, and hope for the future. Considering these factors, the overall suicide risk at this point appears to be low. Patient is appropriate for outpatient follow up.     Collaboration of Care: Collaboration of Care: Other reviewed notes in Epic  Patient/Guardian was advised Release of Information must be obtained prior to any record release in order to collaborate their care with an outside provider. Patient/Guardian was advised if they have not already done so to contact the registration department to sign all necessary forms in order for us  to release information regarding their care.   Consent: Patient/Guardian gives verbal consent for treatment and assignment of benefits for services provided during this visit. Patient/Guardian expressed understanding and agreed to proceed.    Becky Sleet, MD 02/18/2024, 2:07 PM

## 2024-02-16 ENCOUNTER — Ambulatory Visit
Admission: RE | Admit: 2024-02-16 | Discharge: 2024-02-16 | Disposition: A | Discharge status: Home / Self Care | Source: Ambulatory Visit | Attending: Obstetrics and Gynecology | Admitting: Obstetrics and Gynecology

## 2024-02-16 DIAGNOSIS — Z1231 Encounter for screening mammogram for malignant neoplasm of breast: Secondary | ICD-10-CM | POA: Insufficient documentation

## 2024-02-16 DIAGNOSIS — Z01419 Encounter for gynecological examination (general) (routine) without abnormal findings: Secondary | ICD-10-CM

## 2024-02-18 ENCOUNTER — Encounter: Payer: Self-pay | Admitting: Psychiatry

## 2024-02-18 ENCOUNTER — Telehealth (INDEPENDENT_AMBULATORY_CARE_PROVIDER_SITE_OTHER): Admitting: Psychiatry

## 2024-02-18 DIAGNOSIS — F411 Generalized anxiety disorder: Secondary | ICD-10-CM | POA: Diagnosis not present

## 2024-02-18 MED ORDER — FLUOXETINE HCL 10 MG PO CAPS: 10.0000 mg | ORAL_CAPSULE | Freq: Every day | ORAL | 0 refills | Status: DC

## 2024-04-09 NOTE — Progress Notes (Signed)
 Virtual Visit via Video Note  I connected with Becky Hatfield on 04/14/24 at  3:30 PM EDT by a video enabled telemedicine application and verified that I am speaking with the correct person using two identifiers.  Location: Patient: work Provider: home office Persons participated in the visit- patient, provider    I discussed the limitations of evaluation and management by telemedicine and the availability of in person appointments. The patient expressed understanding and agreed to proceed.  I discussed the assessment and treatment plan with the patient. The patient was provided an opportunity to ask questions and all were answered. The patient agreed with the plan and demonstrated an understanding of the instructions.   The patient was advised to call back or seek an in-person evaluation if the symptoms worsen or if the condition fails to improve as anticipated.   Becky Sleet, MD    [facility] MD/PA/NP OP Progress Note  04/14/2024 4:06 PM Becky Hatfield  MRN:  [id number]  Chief Complaint:  Chief Complaint  Patient presents with   Follow-up   HPI:  This is a follow-up appointment for anxiety.  She states that she has been doing well.  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  Although she was informed by others that the class is one of the toughest in terms of behavior issues, she denies much concern.  She does not feel overwhelmed or any anxiety.  Her granddaughter is doing well.  Although she was born prematurely, she has been doing well.  Although she occasionally feels sad, missing her mother, she knows that she is not hurting anymore, and she denies concern about this.  She sleeps well.  She denies any sexual dysfunction.  She denies SI, HI, hallucinations.  She has not taken hydroxyzine  since the last visit.  She feels comfortable to stay on the current medication.   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 Generalized anxiety disorder  F41.1       Past Psychiatric History: Please see initial evaluation for full details. I have reviewed the history. No updates at this time.     Past Medical History:  Past Medical History:  Diagnosis Date   Allergy    seasonal   Kidney stones    x4   Renal disorder     Past Surgical History:  Procedure Laterality Date   BREAST BIOPSY Right 10/21/2016   FIBROCYSTIC CHANGES INCLUDING USUAL DUCTAL Hyperplasia, PASH, Fibroadenomatoid changes   TUBAL LIGATION      Family Psychiatric History: Please see initial evaluation for full details. I have reviewed the history. No updates at this time.     Family History:  Family History  Problem Relation Age of Onset   Hypertension Mother    Stroke Mother    Hypertension Father    Anuerysm Father    Breast cancer Neg Hx    Colon cancer Neg Hx    Colon polyps Neg Hx    Esophageal cancer Neg Hx    Stomach cancer Neg Hx    Rectal cancer Neg Hx     Social History:  Social History   Socioeconomic History   Marital status: Married    Spouse name: Not on file   Number of children: 3   Years of education: Not on file   Highest education level: Bachelor's degree (e.g., BA, AB, BS)  Occupational History   Not on file  Tobacco Use   Smoking status: Never   Smokeless tobacco: Never  Vaping Use   Vaping status: Never Used  Substance and Sexual Activity   Alcohol use: No    Alcohol/week: 0.0 standard drinks of alcohol   Drug use: No   Sexual activity: Yes    Partners: Male    Birth control/protection: Surgical  Other Topics Concern   Not on file  Social History Narrative   Not on file   Social Drivers of  Health   Financial Resource Strain: Not on file  Food Insecurity: Not on file  Transportation Needs: Not on file  Physical Activity: Not on file  Stress: Not on file  Social Connections: Not on file    Allergies: No Known Allergies  Metabolic Disorder Labs: Lab Results  Component Value Date   HGBA1C 5.7 (H) 01/27/2024   No results found for: PROLACTIN Lab Results  Component Value Date   CHOL 233 (H) 01/27/2024   TRIG 51 01/27/2024   HDL 63 01/27/2024   CHOLHDL 3.7 01/27/2024   VLDL 15 07/31/2016   LDLCALC 162 (H) 01/27/2024   LDLCALC 173 (H) 09/18/2020   Lab Results  Component Value Date   TSH 1.330 01/27/2024   TSH 1.530 01/07/2023    Therapeutic Level Labs: No results found for: LITHIUM No results found for: VALPROATE No results found for: CBMZ  Current Medications: Current Outpatient Medications  Medication Sig Dispense Refill   clobetasol  ointment (TEMOVATE ) 0.05 % Apply to affected area every night for 4 weeks, then every other day for 4 weeks and then twice a week for 4 weeks or until resolution 30 g 4   [START ON 05/24/2024] FLUoxetine  (PROZAC ) 10 MG capsule Take 1 capsule (10 mg total) by mouth daily. 90 capsule 0   hydrOXYzine  (ATARAX ) 10 MG tablet Take 1 tablet (10 mg total) by mouth 3 (three) times daily as needed for anxiety. 30 tablet 0   No current facility-administered medications for this visit.     Musculoskeletal: Strength & Muscle Tone: N/A Gait & Station: N/A Patient leans: N/A  Psychiatric Specialty Exam: Review of Systems  Psychiatric/Behavioral: Negative.    All other systems reviewed and are negative.   Last menstrual period 10/02/2015.There is no height or weight on file to calculate BMI.  General Appearance: Well Groomed  Eye Contact:  Good  Speech:  Clear and Coherent  Volume:  Normal  Mood:  good  Affect:  Appropriate, Congruent, and Full Range  Thought Process:  Coherent  Orientation:  Full (Time, Place, and  Person)  Thought Content: Logical   Suicidal Thoughts:  No  Homicidal Thoughts:  No  Memory:  Immediate;   Good  Judgement:  Good  Insight:  Good  Psychomotor Activity:  Normal  Concentration:  Concentration: Good and Attention Span: Good  Recall:  Good  Fund of Knowledge: Good  Language: Good  Akathisia:  No  Handed:  Right  AIMS (if indicated): not done  Assets:  Communication Skills Desire for Improvement  ADL's:  Intact  Cognition: WNL  Sleep:  Good   Screenings: GAD-7    Flowsheet Row Office Visit from 01/26/2024 in [facility] [employer] from 01/06/2023 in [facility] Office Visit from 04/08/2019 in [facility] for [facility] at [place]  Total GAD-7 Score 0 3 1   PHQ2-9    Flowsheet Row Office Visit from 01/26/2024 in [facility] for [facility] at [place] Office Visit from 01/06/2023 in [facility] Office Visit from 09/08/2017 in [facility] for [facility] at [facility] Total Score 0 0 0  PHQ-9 Total Score 0 -- 0   Flowsheet Row ED from 11/28/2022 in [facility]  C-SSRS RISK CATEGORY No Risk     Assessment and Plan:  LAQUITHA HESLIN is a 58 y.o. year old female with a history of anxiety, who presents for follow for below.    1. Generalized anxiety disorder She has faced multiple recent stressors, including the loss of her mother-in-law in January 2024, who suffered from dementia and was emotionally difficult. She also lost her own mother in May. Her daughter in [place]  experienced a miscarriage, and she continues to have conflict with her sister. History:suffering from anxiety for many years that she feels unable to control, not required pharmacological treatment        There has been consistent improvement in her mood symptoms since starting fluoxetine , and she denies any anxiety or  panic attacks since the last visit.  She also denies any sexual side effect from the medication anymore.  Will stay on the current dose to target anxiety.   Plan Continue fluoxetine  10 mg daily - monitor sexual side effect Continue hydroxyzine  10 mg daily as needed for anxiety (She will call if she needs a refill) Next appointment: 12/3 at 3 30, video   The patient demonstrates the following risk factors for suicide: Chronic risk factors for suicide include: psychiatric disorder of anxiety . Acute risk factors for suicide include: loss (financial, interpersonal, professional). Protective factors for this patient include: positive social support, coping skills, and hope for the future. Considering these factors, the overall suicide risk at this point appears to be low. Patient is appropriate for outpatient follow up.       Collaboration of Care: Collaboration of Care: Other reviewed notes in Epic  Patient/Guardian was advised Release of Information must be obtained prior to any record release in order to collaborate their care with an outside provider. Patient/Guardian was advised if they have not already done so to contact the registration department to sign all necessary forms in order for us  to release information regarding their care.   Consent: Patient/Guardian gives verbal consent for treatment and assignment of benefits for services provided during this visit. Patient/Guardian expressed understanding and agreed to proceed.    Becky Sleet, MD 04/14/2024, 4:06 PM

## 2024-04-14 ENCOUNTER — Encounter: Payer: Self-pay | Admitting: Psychiatry

## 2024-04-14 ENCOUNTER — Telehealth (INDEPENDENT_AMBULATORY_CARE_PROVIDER_SITE_OTHER): Admitting: Psychiatry

## 2024-04-14 DIAGNOSIS — F411 Generalized anxiety disorder: Secondary | ICD-10-CM

## 2024-04-14 MED ORDER — FLUOXETINE HCL 10 MG PO CAPS: 10.0000 mg | ORAL_CAPSULE | Freq: Every day | ORAL | 0 refills | Status: DC

## 2024-04-14 NOTE — Patient Instructions (Signed)
 Continue fluoxetine  10 mg daily  Continue hydroxyzine  10 mg daily as needed for anxiety Next appointment: 12/3 at 3 30,

## 2024-07-03 NOTE — Progress Notes (Signed)
 Virtual Visit via Video Note  I connected with Slater DELENA Louder on 07/07/24 at  3:30 PM EST by a video enabled telemedicine application and verified that I am speaking with the correct person using two identifiers.  Location: Patient: school Provider: home office Persons participated in the visit- patient, provider    I discussed the limitations of evaluation and management by telemedicine and the availability of in person appointments. The patient expressed understanding and agreed to proceed.    I discussed the assessment and treatment plan with the patient. The patient was provided an opportunity to ask questions and all were answered. The patient agreed with the plan and demonstrated an understanding of the instructions.   The patient was advised to call back or seek an in-person evaluation if the symptoms worsen or if the condition fails to improve as anticipated.    Katheren Sleet, MD    [facility] MD/PA/NP OP Progress Note  07/07/2024 4:02 PM ANDREANNA MIKOLAJCZAK  MRN:  [id number]  Chief Complaint:  Chief Complaint  Patient presents with   Follow-up   HPI:  This is a follow-up appointment for anxiety.  She states that she has been doing good.  She enjoyed Thanksgiving.  She was able to see her children, and granddaughter.  She was feeling a little sad as she was missing her mother.  She also recalls difficult time with her mother-in-law who had Alzheimer.  She called her ugly names.  Her last word was that she had to put up with her all the time.  Although she knows that it was not her, it was still difficult.  She does not dwell on things, stating that they are not suffering.  She denies feeling depressed.  Although she may feel anxious at times, she has been able to redirect herself.  She has not taken any hydroxyzine .  She denies any sexual side effect and thinks her body is getting used to the medication.  She denies SI, hallucinations.  She feels comfortable to stay on the current  medication.    Substance use   Tobacco Alcohol Other substances/  Current denies denies denies  Past denies denies denies  Past Treatment          Support: husband Household: husband Marital status: married for over 30 years Number of children: 3 (2 daughters, son) Employment: middle engineer, site at [employer] Education:  Bachelor in elementary education   Visit Diagnosis:    ICD-10-CM   1. Generalized anxiety disorder  F41.1       Past Psychiatric History: Please see initial evaluation for full details. I have reviewed the history. No updates at this time.     Past Medical History:  Past Medical History:  Diagnosis Date   Allergy    seasonal   Kidney stones    x4   Renal disorder     Past Surgical History:  Procedure Laterality Date   BREAST BIOPSY Right 10/21/2016   FIBROCYSTIC CHANGES INCLUDING USUAL DUCTAL Hyperplasia, PASH, Fibroadenomatoid changes   TUBAL LIGATION      Family Psychiatric History: Please see initial evaluation for full details. I have reviewed the history. No updates at this time.    Family History:  Family History  Problem Relation Age of Onset   Hypertension Mother    Stroke Mother    Hypertension Father    Anuerysm Father    Breast cancer Neg Hx    Colon cancer Neg Hx    Colon polyps Neg  Hx    Esophageal cancer Neg Hx    Stomach cancer Neg Hx    Rectal cancer Neg Hx     Social History:  Social History   Socioeconomic History   Marital status: Married    Spouse name: Not on file   Number of children: 3   Years of education: Not on file   Highest education level: Bachelor's degree (e.g., BA, AB, BS)  Occupational History   Not on file  Tobacco Use   Smoking status: Never   Smokeless tobacco: Never  Vaping Use   Vaping status: Never Used  Substance and Sexual Activity   Alcohol use: No    Alcohol/week: 0.0 standard drinks of alcohol   Drug use: No   Sexual activity: Yes    Partners: Male    Birth  control/protection: Surgical  Other Topics Concern   Not on file  Social History Narrative   Not on file   Social Drivers of Health   Financial Resource Strain: Not on file  Food Insecurity: Not on file  Transportation Needs: Not on file  Physical Activity: Not on file  Stress: Not on file  Social Connections: Not on file    Allergies: No Known Allergies  Metabolic Disorder Labs: Lab Results  Component Value Date   HGBA1C 5.7 (H) 01/27/2024   No results found for: PROLACTIN Lab Results  Component Value Date   CHOL 233 (H) 01/27/2024   TRIG 51 01/27/2024   HDL 63 01/27/2024   CHOLHDL 3.7 01/27/2024   VLDL 15 07/31/2016   LDLCALC 162 (H) 01/27/2024   LDLCALC 173 (H) 09/18/2020   Lab Results  Component Value Date   TSH 1.330 01/27/2024   TSH 1.530 01/07/2023    Therapeutic Level Labs: No results found for: LITHIUM No results found for: VALPROATE No results found for: CBMZ  Current Medications: Current Outpatient Medications  Medication Sig Dispense Refill   clobetasol  ointment (TEMOVATE ) 0.05 % Apply to affected area every night for 4 weeks, then every other day for 4 weeks and then twice a week for 4 weeks or until resolution 30 g 4   [START ON 08/22/2024] FLUoxetine  (PROZAC ) 10 MG capsule Take 1 capsule (10 mg total) by mouth daily. 90 capsule 0   hydrOXYzine  (ATARAX ) 10 MG tablet Take 1 tablet (10 mg total) by mouth 3 (three) times daily as needed for anxiety. 30 tablet 0   No current facility-administered medications for this visit.     Musculoskeletal: Strength & Muscle Tone: N/A Gait & Station: N/A Patient leans: N/A  Psychiatric Specialty Exam: Review of Systems  Psychiatric/Behavioral: Negative.    All other systems reviewed and are negative.   Last menstrual period 10/02/2015.There is no height or weight on file to calculate BMI.  General Appearance: Well Groomed  Eye Contact:  Good  Speech:  Clear and Coherent  Volume:  Normal   Mood:  good  Affect:  Appropriate, Congruent, and Full Range  Thought Process:  Coherent  Orientation:  Full (Time, Place, and Person)  Thought Content: Logical   Suicidal Thoughts:  No  Homicidal Thoughts:  No  Memory:  Immediate;   Good  Judgement:  Good  Insight:  Good  Psychomotor Activity:  Normal  Concentration:  Concentration: Good and Attention Span: Good  Recall:  Good  Fund of Knowledge: Good  Language: Good  Akathisia:  No  Handed:  Right  AIMS (if indicated): not done  Assets:  Communication Skills Desire for Improvement  ADL's:  Intact  Cognition: WNL  Sleep:  Good   Screenings: GAD-7    Flowsheet Row Office Visit from 01/26/2024 in [facility] for [employer] [facility] [employer] Visit from 01/06/2023 in [facility] Office Visit from 04/08/2019 in [facility] for [facility] [place]  Total GAD-7 Score 0 3 1   PHQ2-9    Flowsheet Row Office Visit from 01/26/2024 in [facility] for [facility] [place] Office Visit from 01/06/2023 in [facility] Office Visit from 09/08/2017 in [facility] for [facility] Total Score 0 0 0  PHQ-9 Total Score 0 -- 0   Flowsheet Row ED [facility]  C-SSRS RISK CATEGORY No Risk     Assessment and Plan:  AVYNN KLASSEN is a 58 y.o. year old female with a history of anxiety, who presents for follow for below.    1. Generalized anxiety disorder She has faced multiple recent stressors, including the loss of her mother-in-law in January 2024, who suffered from dementia and was emotionally difficult. She also lost her own mother in May. Her daughter in [place]  experienced a miscarriage, and she continues to have conflict with her sister. History:suffering from anxiety for many years that she feels unable to control, not required pharmacological  treatment         Although she reports sadness related to grief of most of her family members, and the last interaction with her mother-in-law had Alzheimer, she denies much concern about her mood symptoms.  She has been able to redirect herself when she had intense anxiety, and denies concern of sexual side effect from medication anymore.  Will continue current dose of fluoxetine  to target anxiety.    Plan Continue fluoxetine  10 mg daily - monitor sexual side effect Continue hydroxyzine  10 mg daily as needed for anxiety (She will call if she needs a refill) Next appointment: 4/153 at 3 30, video   The patient demonstrates the following risk factors for suicide: Chronic risk factors for suicide include: psychiatric disorder of anxiety . Acute risk factors for suicide include: loss (financial, interpersonal, professional). Protective factors for this patient include: positive social support, coping skills, and hope for the future. Considering these factors, the overall suicide risk at this point appears to be low. Patient is appropriate for outpatient follow up.     Collaboration of Care: Collaboration of Care: Other reviewed notes in Epic  Patient/Guardian was advised Release of Information must be obtained prior to any record release in order to collaborate their care with an outside provider. Patient/Guardian was advised if they have not already done so to contact the registration department to sign all necessary forms in order for us  to release information regarding their care.   Consent: Patient/Guardian gives verbal consent for treatment and assignment of benefits for services provided during this visit. Patient/Guardian expressed understanding and agreed to proceed.    Katheren Sleet, MD 07/07/2024, 4:02 PM

## 2024-07-07 ENCOUNTER — Telehealth: Admitting: Psychiatry

## 2024-07-07 ENCOUNTER — Encounter: Payer: Self-pay | Admitting: Psychiatry

## 2024-07-07 DIAGNOSIS — F411 Generalized anxiety disorder: Secondary | ICD-10-CM | POA: Diagnosis not present

## 2024-07-07 MED ORDER — FLUOXETINE HCL 10 MG PO CAPS: 10.0000 mg | ORAL_CAPSULE | Freq: Every day | ORAL | 0 refills | Status: AC

## 2024-11-17 ENCOUNTER — Telehealth: Admitting: Psychiatry
